# Patient Record
Sex: Male | Born: 1980 | Race: White | Hispanic: No | Marital: Single | Smoking: Former smoker
Health system: Southern US, Community
[De-identification: ages and names within clinical notes are randomized; demographics above are authoritative.]

## PROBLEM LIST (undated history)

## (undated) DIAGNOSIS — M7989 Other specified soft tissue disorders: Secondary | ICD-10-CM

## (undated) DIAGNOSIS — Z973 Presence of spectacles and contact lenses: Secondary | ICD-10-CM

## (undated) DIAGNOSIS — L568 Other specified acute skin changes due to ultraviolet radiation: Secondary | ICD-10-CM

## (undated) DIAGNOSIS — I456 Pre-excitation syndrome: Secondary | ICD-10-CM

## (undated) HISTORY — PX: NO PAST SURGERIES: SHX2092

---

## 2001-03-28 ENCOUNTER — Encounter: Payer: Self-pay | Admitting: Emergency Medicine

## 2001-03-28 ENCOUNTER — Emergency Department (HOSPITAL_COMMUNITY): Admission: EM | Admit: 2001-03-28 | Discharge: 2001-03-28 | Payer: Self-pay | Admitting: Emergency Medicine

## 2004-11-13 ENCOUNTER — Emergency Department (HOSPITAL_COMMUNITY): Admission: EM | Admit: 2004-11-13 | Discharge: 2004-11-13 | Payer: Self-pay | Admitting: Emergency Medicine

## 2006-10-21 ENCOUNTER — Emergency Department (HOSPITAL_COMMUNITY): Admission: EM | Admit: 2006-10-21 | Discharge: 2006-10-21 | Payer: Self-pay | Admitting: Emergency Medicine

## 2008-01-11 ENCOUNTER — Emergency Department (HOSPITAL_COMMUNITY): Admission: EM | Admit: 2008-01-11 | Discharge: 2008-01-11 | Payer: Self-pay | Admitting: Family Medicine

## 2010-01-16 ENCOUNTER — Emergency Department (HOSPITAL_COMMUNITY)
Admission: EM | Admit: 2010-01-16 | Discharge: 2010-01-16 | Payer: Self-pay | Source: Home / Self Care | Admitting: Emergency Medicine

## 2011-01-02 LAB — DIFFERENTIAL
Basophils Absolute: 0
Basophils Relative: 0
Eosinophils Absolute: 0.2
Eosinophils Relative: 1
Lymphocytes Relative: 17
Lymphs Abs: 3.1
Monocytes Absolute: 0.3
Monocytes Relative: 2 — ABNORMAL LOW
Neutro Abs: 14.9 — ABNORMAL HIGH
Neutrophils Relative %: 81 — ABNORMAL HIGH

## 2011-01-02 LAB — CBC
HCT: 47
Hemoglobin: 16.1
MCHC: 34.3
MCV: 86.1
Platelets: 319
RBC: 5.46
RDW: 13.2
WBC: 18.5 — ABNORMAL HIGH

## 2011-01-02 LAB — BASIC METABOLIC PANEL
BUN: 7
CO2: 24
Calcium: 10
Chloride: 106
Creatinine, Ser: 0.97
GFR calc Af Amer: >60
GFR calc non Af Amer: >60
Glucose, Bld: 105 — ABNORMAL HIGH
Potassium: 3.6
Sodium: 141

## 2011-01-02 LAB — RAPID STREP SCREEN (MED CTR MEBANE ONLY): Streptococcus, Group A Screen (Direct): NEGATIVE

## 2011-01-02 LAB — MONONUCLEOSIS SCREEN: Mono Screen: NEGATIVE

## 2011-10-31 ENCOUNTER — Encounter (HOSPITAL_COMMUNITY): Payer: Self-pay | Admitting: *Deleted

## 2011-10-31 ENCOUNTER — Emergency Department (HOSPITAL_COMMUNITY)
Admission: EM | Admit: 2011-10-31 | Discharge: 2011-10-31 | Disposition: A | Discharge status: Home / Self Care | Payer: Self-pay | Attending: Emergency Medicine | Admitting: Emergency Medicine

## 2011-10-31 DIAGNOSIS — H6090 Unspecified otitis externa, unspecified ear: Secondary | ICD-10-CM

## 2011-10-31 DIAGNOSIS — Z87891 Personal history of nicotine dependence: Secondary | ICD-10-CM | POA: Insufficient documentation

## 2011-10-31 DIAGNOSIS — H60399 Other infective otitis externa, unspecified ear: Secondary | ICD-10-CM | POA: Insufficient documentation

## 2011-10-31 MED ORDER — TRAMADOL HCL 50 MG PO TABS: 50.0000 mg | ORAL_TABLET | Freq: Four times a day (QID) | ORAL | Status: AC | PRN

## 2011-10-31 MED ORDER — IBUPROFEN 800 MG PO TABS
800.0000 mg | ORAL_TABLET | Freq: Once | ORAL | Status: AC
  Administered 2011-10-31: 800 mg via ORAL
  Filled 2011-10-31: qty 1

## 2011-10-31 MED ORDER — IBUPROFEN 800 MG PO TABS: 800.0000 mg | ORAL_TABLET | Freq: Three times a day (TID) | ORAL | Status: AC | PRN

## 2011-10-31 MED ORDER — CIPROFLOXACIN-DEXAMETHASONE 0.3-0.1 % OT SUSP
4.0000 [drp] | Freq: Once | OTIC | Status: AC
  Administered 2011-10-31: 4 [drp] via OTIC
  Filled 2011-10-31: qty 7.5

## 2011-10-31 NOTE — ED Notes (Signed)
Waiting for ear drops to come from pharmacy prior to discharge.

## 2011-10-31 NOTE — ED Provider Notes (Signed)
History     CSN: [id number]  Arrival date & time 10/31/11  8657   First MD Initiated Contact with Patient 10/31/11 [phone]      Chief Complaint  Patient presents with  . Otalgia    (Consider location/radiation/quality/duration/timing/severity/associated sxs/prior treatment) HPI Comments: Patient with ear pain for 2-3 days. Patient noticed worsening pain and crusting starting yesterday. No fever, N/V, redness of ear or around ear. Pain radiates behind ear. No history of DM. 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 No treatments prior to arrival. Subjective decrease in hearing. Onset gradual. Course is gradually worsening. Nothing makes symptoms better. Movement of the ear makes symptoms worse.   Patient is a 31 y.o. male presenting with ear pain. The history is provided by the patient.  Otalgia This is a new problem. The current episode started 2 days ago. There is pain in the right ear. The problem occurs constantly. The problem has been gradually worsening. There has been no fever. The pain is moderate. Associated symptoms include ear discharge (crusting). Pertinent negatives include no headaches, no hearing loss, no rhinorrhea, no sore throat, no abdominal pain, no diarrhea, no vomiting, no neck pain and no rash.    History reviewed. No pertinent past medical history.  History reviewed. No pertinent past surgical history.  No family history on file.  History  Substance Use Topics  . Smoking status: Former Smoker    Quit date: 09/18/2011  . Smokeless tobacco: Not on file  . Alcohol Use: No      Review of Systems  Constitutional: Negative for fever.  HENT: Positive for ear pain and ear discharge (crusting). Negative for hearing loss, sore throat, rhinorrhea and neck pain.   Eyes: Negative for redness.  Gastrointestinal: Negative for nausea, vomiting, abdominal pain and diarrhea.  Skin: Negative for rash.    Neurological: Negative for headaches.    Allergies  Penicillins  Home Medications   Current Outpatient Rx  Name Route Sig Dispense Refill  . IBUPROFEN 800 MG PO TABS Oral Take 1 tablet (800 mg total) by mouth every 8 (eight) hours as needed for pain. 15 tablet 0  . TRAMADOL HCL 50 MG PO TABS Oral Take 1 tablet (50 mg total) by mouth every 6 (six) hours as needed for pain. 15 tablet 0    BP 113/73  Pulse 54  Temp 97.3 F (36.3 C)  Resp 18  SpO2 100%  Physical Exam  Nursing note and vitals reviewed. Constitutional: He appears well-developed and well-nourished.  HENT:  Head: Normocephalic and atraumatic.  Right Ear: Tympanic membrane normal. No lacerations. There is drainage (crusting) and tenderness (with tugging on external ear). No swelling. No foreign bodies. There is mastoid tenderness (mild). Tympanic membrane is not injected, not perforated and not erythematous. No hemotympanum. No decreased hearing is noted.  Left Ear: Hearing and tympanic membrane normal. No drainage. Tympanic membrane is not injected. No decreased hearing is noted.  Nose: Nose normal.  Mouth/Throat: Uvula is midline, oropharynx is clear and moist and mucous membranes are normal.  Eyes: Conjunctivae are normal. Right eye exhibits no discharge. Left eye exhibits no discharge.  Neck: Normal range of motion. Neck supple.  Cardiovascular: Normal rate, regular rhythm and normal heart sounds.   Pulmonary/Chest: Effort normal and breath sounds normal.  Abdominal: Soft. There is no tenderness.  Neurological: He is alert.  Skin: Skin is warm and dry.  Psychiatric: He has a normal  mood and affect.    ED Course  Procedures (including critical care time)  Labs Reviewed - No data to display No results found.   1. Otitis externa    7:13 AM Patient seen and examined. Medications ordered.   Vital signs reviewed and are as follows: Filed Vitals:   10/31/11 0657  BP: 113/73  Pulse: 54  Temp: 97.3 F  (36.3 C)  Resp: 18   Patient counseled on signs and symptoms to return including worsening redness of his ear or around his ear, fever, pus draining from his ear. Patient given ENT followup and urged followup if no improvement in the next 2-3 days. Patient verbalizes understanding and agrees with the plan.  Patient counseled on use of narcotic pain medications. Counseled not to combine these medications with others containing tylenol. Urged not to drink alcohol, drive, or perform any other activities that requires focus while taking these medications. The patient verbalizes understanding and agrees with the plan.  MDM  Desquamation and erythema of ear canal -- consistent with otitis externa. Minimal mastoid tenderness. Pain with movement of pinna. No redness or erythema consistent with malignant otitis externa or mastoiditis. No fever or h/o DM. Patient appears well and non-toxic. Appropriate return instructions given.         [place], [place] 10/31/11 [phone]

## 2011-10-31 NOTE — ED Provider Notes (Signed)
Medical screening examination/treatment/procedure(s) were performed by non-physician practitioner and as supervising physician I was immediately available for consultation/collaboration.  Christionna Poland, MD 10/31/11 0730 

## 2011-10-31 NOTE — ED Notes (Signed)
C/o R ear & R mastoid area pain, possibly some drainage (caked up in ear, clear-ish), wears ear plugs at work, has not inserted drops, mentions using Q tip on Sunday.

## 2011-11-20 ENCOUNTER — Encounter (HOSPITAL_COMMUNITY): Payer: Self-pay | Admitting: *Deleted

## 2011-11-20 ENCOUNTER — Emergency Department (HOSPITAL_COMMUNITY)
Admission: EM | Admit: 2011-11-20 | Discharge: 2011-11-20 | Disposition: A | Discharge status: Home / Self Care | Payer: Self-pay | Attending: Emergency Medicine | Admitting: Emergency Medicine

## 2011-11-20 DIAGNOSIS — L02519 Cutaneous abscess of unspecified hand: Secondary | ICD-10-CM | POA: Insufficient documentation

## 2011-11-20 DIAGNOSIS — Z87891 Personal history of nicotine dependence: Secondary | ICD-10-CM | POA: Insufficient documentation

## 2011-11-20 DIAGNOSIS — L03019 Cellulitis of unspecified finger: Secondary | ICD-10-CM | POA: Insufficient documentation

## 2011-11-20 DIAGNOSIS — L0291 Cutaneous abscess, unspecified: Secondary | ICD-10-CM

## 2011-11-20 DIAGNOSIS — Z880 Allergy status to penicillin: Secondary | ICD-10-CM | POA: Insufficient documentation

## 2011-11-20 DIAGNOSIS — Z23 Encounter for immunization: Secondary | ICD-10-CM | POA: Insufficient documentation

## 2011-11-20 MED ORDER — HYDROCODONE-ACETAMINOPHEN 7.5-500 MG PO TABS: 1.0000 | ORAL_TABLET | Freq: Four times a day (QID) | ORAL | Status: AC | PRN

## 2011-11-20 MED ORDER — ACETAMINOPHEN 325 MG PO TABS
975.0000 mg | ORAL_TABLET | Freq: Once | ORAL | Status: AC
  Administered 2011-11-20: 975 mg via ORAL
  Filled 2011-11-20: qty 3

## 2011-11-20 MED ORDER — TETANUS-DIPHTH-ACELL PERTUSSIS 5-2.5-18.5 LF-MCG/0.5 IM SUSP
0.5000 mL | Freq: Once | INTRAMUSCULAR | Status: AC
  Administered 2011-11-20: 0.5 mL via INTRAMUSCULAR
  Filled 2011-11-20: qty 0.5

## 2011-11-20 MED ORDER — SULFAMETHOXAZOLE-TRIMETHOPRIM 800-160 MG PO TABS: 1.0000 | ORAL_TABLET | Freq: Two times a day (BID) | ORAL | Status: AC

## 2011-11-20 NOTE — Consult Note (Signed)
Reason for Consult: left small finger infection   Referring Physician: [name] is an 31 y.o. male.  HPI: See ED note from Dr. Ethelda Clark  History reviewed. No pertinent past medical history.  History reviewed. No pertinent past surgical history.  History reviewed. No pertinent family history.  Social History:  reports that he quit smoking about 2 months ago. He does not have any smokeless tobacco history on file. He reports that he uses illicit drugs (Marijuana). He reports that he does not drink alcohol.  Allergies:  Allergies  Allergen Reactions  . Penicillins Hives and Rash    Medications: I have reviewed the patient's current medications.  No results found for this or any previous visit (from the past 48 hour(s)).  No results found.  No RECENT ILLNESSES OR HOSPITALIZATIONS  Blood pressure 111/68, pulse 53, temperature 97.5 F (36.4 C), temperature source Oral, resp. rate 15, SpO2 100.00%. General Appearance:  Alert, cooperative, no distress, appears stated age  Head:  Normocephalic, without obvious abnormality, atraumatic  Eyes:  Pupils equal, conjunctiva/corneas clear,         Throat: Lips, mucosa, and tongue normal; teeth and gums normal  Neck: No visible masses     Lungs:   respirations unlabored  Chest Wall:  No tenderness or deformity  Heart:  Regular rate and rhythm,  Abdomen:   Soft, non-tender,         Extremities: LEFT HAND: MILD SWELLING AND FLUCTUANCE OVER DORSUM OF SMALL FINGER P1 REGION. FINGERS WARM WELL PERFUSED MILD REDNESS DORSUM OF HAND. NO EVIDENCE OF DEEP SPACE INFECTION NO INVOLVEMENT OF LONG/RING/INDEX/THUMB   Pulses: 2+ and symmetric  Skin: Skin color, texture, turgor normal, no rashes or lesions     Neurologic: Normal   Assessment/Plan: LEFT SMALL FINGER ABSCESS  PROCEDURE: AFTER VERBAL CONSENT OBTAINED WE PERFORMED BEDSIDE I/D. FINGER WAS ANESTHETIZED WITH 1% LIDOCAINE, FINGER PREPPED WITH BETADINE AND  THEN LOCAL INCISION AND DRAINAGE PERFORMED. WOUND CULTURES TAKEN SMALL AMOUNT OF PURULENCE EXPRESSED FROM AREA WOUND THEN PACKED WITH IODOFORM GAUZE PATIENT TOLERATED STERILE DRESSING APPLIED AND SMALL FINGER SPLINT PT TOLERATED PROCEDURE.  PLAN: ORAL ABX, PCN ALLERGY WILL DO BACTRIM F/U IN OFFICE ON Thursday FOR PACKING CHANGE KEEP DRESSING ON AT ALL TIMES ORAL PAIN MEDS PT VOICED UNDERSTANDING OF PLAN  Julian Clark 11/20/2011, 5:18 PM

## 2011-11-20 NOTE — ED Provider Notes (Signed)
History   This chart was scribed for Doug Sou, MD by Melba Coon. The patient was seen in room TR05C/TR05C and the patient's care was started at 3:30PM.    CSN: [id number]  Arrival date & time 11/20/11  1305   None     Chief Complaint  Patient presents with  . Hand Injury    (Consider location/radiation/quality/duration/timing/severity/associated sxs/prior treatment) The history is provided by the patient. No language interpreter was used.   ENOC GETTER is a 31 y.o. male who presents to the Emergency Department complaining of constant, moderate to severe left hand pain, redness and swelling with an onset 3 days ago. Pt has a small wound at the site of infection but cause of the wound is unknown. Pt states that the pain radiates to his left elbow. Tetanus shot is not up to date. No other pertinent medical symptoms.   History reviewed. No pertinent past medical history.  History reviewed. No pertinent past surgical history.  History reviewed. No pertinent family history.  History  Substance Use Topics  . Smoking status: Former Smoker    Quit date: 09/18/2011  . Smokeless tobacco: Not on file  . Alcohol Use: No      Review of Systems  Constitutional: Negative.   HENT: Negative.   Respiratory: Negative.   Cardiovascular: Negative.   Gastrointestinal: Negative.   Musculoskeletal: Positive for arthralgias (left hand).  Skin: Negative.   Neurological: Negative.   Hematological: Negative.   Psychiatric/Behavioral: Negative.      Allergies  Penicillins  Home Medications  No current outpatient prescriptions on file.  BP 111/68  Pulse 53  Temp 97.5 F (36.4 C) (Oral)  Resp 15  SpO2 100%  Physical Exam  Nursing note and vitals reviewed. Constitutional: He is oriented to person, place, and time. He appears well-developed and well-nourished. No distress.  HENT:  Head: Normocephalic and atraumatic.  Eyes: EOM are normal.  Neck: Neck supple. No  tracheal deviation present.  Cardiovascular: Normal rate.   Pulmonary/Chest: Effort normal. No respiratory distress.  Musculoskeletal: Normal range of motion. He exhibits tenderness.       LUE: Dime sized lesion on left hand on proximal phalanx, dorsal aspect; entirety of dorsum of left hand is swollen and mildly tender; volar mild tender; no red streaks up arm or axial arm; radial pulse 2+  Neurological: He is alert and oriented to person, place, and time.  Skin: Skin is warm and dry.  Psychiatric: He has a normal mood and affect. His behavior is normal.    ED Course  Procedures (including critical care time)  DIAGNOSTIC STUDIES: Oxygen Saturation is 99% on room air, normal by my interpretation.    COORDINATION OF CARE:  3:33PM - hand specialist will be consulted. Tetanus shot and tylenol will be ordered for the pt.   Labs Reviewed - No data to display No results found.   No diagnosis found.  4:25 PM spoke with Dr.Ortmann who will come to evaluate patient  MDM  Suspect early extensor tenosynovitis  Diagnosis abscess left fifth finger  I personally performed the services described in this documentation, which was scribed in my presence. The recorded information has been reviewed and considered.        Doug Sou, MD 11/20/11 [phone]

## 2011-11-20 NOTE — ED Notes (Signed)
To ED for eval of left hand redness and swelling since Friday. Pt has small wound to left small finger. Unknown injury or bite.

## 2011-11-23 LAB — CULTURE, ROUTINE-ABSCESS

## 2011-11-24 NOTE — ED Notes (Signed)
+  Abscess. +MRSA. Chart sent to EDP office for review. Will call and inform patient of +MRSA.

## 2011-11-26 NOTE — ED Notes (Signed)
Left message for patient to call back  

## 2011-11-27 NOTE — ED Notes (Signed)
I have attempted to contact this patient by phone with the following results: massage left with mother for patient to return call.

## 2011-11-28 NOTE — ED Notes (Signed)
Patient followed up with Dr Orlan Leavens today per mother.

## 2013-06-14 ENCOUNTER — Emergency Department (HOSPITAL_COMMUNITY)
Admission: EM | Admit: 2013-06-14 | Discharge: 2013-06-14 | Disposition: A | Discharge status: Home / Self Care | Payer: Medicaid Other | Attending: Emergency Medicine | Admitting: Emergency Medicine

## 2013-06-14 DIAGNOSIS — Y929 Unspecified place or not applicable: Secondary | ICD-10-CM | POA: Insufficient documentation

## 2013-06-14 DIAGNOSIS — S61411A Laceration without foreign body of right hand, initial encounter: Secondary | ICD-10-CM

## 2013-06-14 DIAGNOSIS — Y9389 Activity, other specified: Secondary | ICD-10-CM | POA: Insufficient documentation

## 2013-06-14 DIAGNOSIS — S61409A Unspecified open wound of unspecified hand, initial encounter: Secondary | ICD-10-CM | POA: Insufficient documentation

## 2013-06-14 DIAGNOSIS — Z87891 Personal history of nicotine dependence: Secondary | ICD-10-CM | POA: Insufficient documentation

## 2013-06-14 DIAGNOSIS — Z880 Allergy status to penicillin: Secondary | ICD-10-CM | POA: Insufficient documentation

## 2013-06-14 DIAGNOSIS — W298XXA Contact with other powered powered hand tools and household machinery, initial encounter: Secondary | ICD-10-CM | POA: Insufficient documentation

## 2013-06-14 NOTE — Discharge Instructions (Signed)
Keep wound clean and dry  Wash daily with soap and water  Return to the emergency department if you develop any changing/worsening condition, pus, spreading redness/swelling, fever, severe pain, or any other concerns (please read additional information regarding your condition below)     Laceration Care, Adult A laceration is a cut or lesion that goes through all layers of the skin and into the tissue just beneath the skin. TREATMENT  Some lacerations may not require closure. Some lacerations may not be able to be closed due to an increased risk of infection. It is important to see your caregiver as soon as possible after an injury to minimize the risk of infection and maximize the opportunity for successful closure. If closure is appropriate, pain medicines may be given, if needed. The wound will be cleaned to help prevent infection. Your caregiver will use stitches (sutures), staples, wound glue (adhesive), or skin adhesive strips to repair the laceration. These tools bring the skin edges together to allow for faster healing and a better cosmetic outcome. However, all wounds will heal with a scar. Once the wound has healed, scarring can be minimized by covering the wound with sunscreen during the day for 1 full year. HOME CARE INSTRUCTIONS  For sutures or staples:  Keep the wound clean and dry.  If you were given a bandage (dressing), you should change it at least once a day. Also, change the dressing if it becomes wet or dirty, or as directed by your caregiver.  Wash the wound with soap and water 2 times a day. Rinse the wound off with water to remove all soap. Pat the wound dry with a clean towel.  After cleaning, apply a thin layer of the antibiotic ointment as recommended by your caregiver. This will help prevent infection and keep the dressing from sticking.  You may shower as usual after the first 24 hours. Do not soak the wound in water until the sutures are removed.  Only take  over-the-counter or prescription medicines for pain, discomfort, or fever as directed by your caregiver.  Get your sutures or staples removed as directed by your caregiver. For skin adhesive strips:  Keep the wound clean and dry.  Do not get the skin adhesive strips wet. You may bathe carefully, using caution to keep the wound dry.  If the wound gets wet, pat it dry with a clean towel.  Skin adhesive strips will fall off on their own. You may trim the strips as the wound heals. Do not remove skin adhesive strips that are still stuck to the wound. They will fall off in time. For wound adhesive:  You may briefly wet your wound in the shower or bath. Do not soak or scrub the wound. Do not swim. Avoid periods of heavy perspiration until the skin adhesive has fallen off on its own. After showering or bathing, gently pat the wound dry with a clean towel.  Do not apply liquid medicine, cream medicine, or ointment medicine to your wound while the skin adhesive is in place. This may loosen the film before your wound is healed.  If a dressing is placed over the wound, be careful not to apply tape directly over the skin adhesive. This may cause the adhesive to be pulled off before the wound is healed.  Avoid prolonged exposure to sunlight or tanning lamps while the skin adhesive is in place. Exposure to ultraviolet light in the first year will darken the scar.  The skin adhesive will usually  remain in place for 5 to 10 days, then naturally fall off the skin. Do not pick at the adhesive film. You may need a tetanus shot if:  You cannot remember when you had your last tetanus shot.  You have never had a tetanus shot. If you get a tetanus shot, your arm may swell, get red, and feel warm to the touch. This is common and not a problem. If you need a tetanus shot and you choose not to have one, there is a rare chance of getting tetanus. Sickness from tetanus can be serious. SEEK MEDICAL CARE IF:   You  have redness, swelling, or increasing pain in the wound.  You see a red line that goes away from the wound.  You have yellowish-white fluid (pus) coming from the wound.  You have a fever.  You notice a bad smell coming from the wound or dressing.  Your wound breaks open before or after sutures have been removed.  You notice something coming out of the wound such as wood or glass.  Your wound is on your hand or foot and you cannot move a finger or toe. SEEK IMMEDIATE MEDICAL CARE IF:   Your pain is not controlled with prescribed medicine.  You have severe swelling around the wound causing pain and numbness or a change in color in your arm, hand, leg, or foot.  Your wound splits open and starts bleeding.  You have worsening numbness, weakness, or loss of function of any joint around or beyond the wound.  You develop painful lumps near the wound or on the skin anywhere on your body. MAKE SURE YOU:   Understand these instructions.  Will watch your condition.  Will get help right away if you are not doing well or get worse. Document Released: 03/06/2005 Document Revised: 05/29/2011 Document Reviewed: 08/30/2010 [facility] Patient Information 2014 [place], [place].   Emergency Department Resource Guide 1) Find a Doctor and Pay Out of Pocket Although you won't have to find out who is covered by your insurance plan, it is a good idea to ask around and get recommendations. You will then need to call the office and see if the doctor you have chosen will accept you as a new patient and what types of options they offer for patients who are self-pay. Some doctors offer discounts or will set up payment plans for their patients who do not have insurance, but you will need to ask so you aren't surprised when you get to your appointment.  2) Contact Your Local Health Department Not all health departments have doctors that can see patients for sick visits, but many do, so it is worth a call to  see if yours does. If you don't know where your local health department is, you can check in your phone book. The CDC also has a tool to help you locate your state's health department, and many state websites also have listings of all of their local health departments.  3) Find a [facility] If your illness is not likely to be very severe or complicated, you may want to try a walk in clinic. These are popping up all over the country in pharmacies, drugstores, and shopping centers. They're usually staffed by nurse practitioners or physician assistants that have been trained to treat common illnesses and complaints. They're usually fairly quick and inexpensive. However, if you have serious medical issues or chronic medical problems, these are probably not your best option.  No Primary Care Doctor: -  Call Health Connect at  [phone] - they can help you locate a primary care doctor that  accepts your insurance, provides certain services, etc. - Physician Referral Service- [phone]  Chronic Pain Problems: Organization         Address  Phone   Notes  [facility]  [phone] Patients need to be referred by their primary care doctor.   Medication Assistance: Organization         Address  Phone   Notes  [facility] Medication [facility] [address] [place] [place], [place] [address] [phone] --Must be a resident of [facility] -- Must have NO insurance coverage whatsoever (no Medicaid/ Medicare, etc.) -- The pt. MUST have a primary care doctor that directs their care regularly and follows them in the community   Med[facility]ist  [phone]   [employer]  [phone]    Agencies that provide inexpensive medical care: Organization         Address  Phone   Notes  [facility]  [phone]   [facility]    [phone]   [facility] [address] [place], [place] [address] [phone]   [facility] [address] [place], [place] [phone]   Planned Parenthood    [phone]   [facility]    [phone]   [facility]  [address], [place] Phone:  [phone], Fax:  [phone] Hours of Operation:  9 am - 6 pm, M-F.  Also accepts Medicaid/Medicare and self-pay.  [facility]  [address], [place] Phone: [phone], Fax: [phone]. Hours of Operation:  8:30 am - 5:30 pm, M-F.  Also accepts Medicaid and self-pay.  [facility] [place] [address] [place] Phone: [phone]   [facility] [address] Natasha Bence [place], [place] [phone], Ext. [phone] Mondays & Thursdays: 7-9 AM.  First 15 patients are seen on a first come, first serve basis.    Medicaid-accepting [facility] Providers:  Organization         Address  Phone   Notes  [facility] [address], [place] [phone] Also accepts self-pay patients.  [facility] [address] Laurell Josephs [place], [place]  [phone]   [facility] [address], [place] [phone]   [facility] [address], [place] [phone]   Renaye Rakers [address], [place]   [phone] Only accepts [place] Access [place] patients after they have their name applied to their card.   Self-Pay (no insurance) in [facility]:  Organization         Address  Phone   Notes  Sickle Cell Patients, [facility] [address] [place], [place] [phone]   [facility] Urgent Care [address] [place], [place] [phone]   [facility] [place]  [address], [place] [phone]   [facility]/Dr. Osei-Bonsu  [address] [place] or [address], [place] [phone] Phone number for both [place] and [place] locations is the same.  [facility]gent Medical and [facility] [address] [place]in[place] Otto [phone]   [facility] [place]  [address], [place] or [address] [phone]   [facility] [place] [phone], phone; [phone], fax Sees patients 1st and 3rd Saturday of every month.  Must not qualify for public or private insurance (i.e. Medicaid, Medicare, [place] Health Choice, Veterans' Benefits)  Household income should be no more than 200% of the poverty level The clinic cannot treat you if you are pregnant or think you are pregnant  Sexually transmitted diseases are not treated at the clinic.    Dental Care: Organization         Address  Phone  Notes  [facility] Department of [facility] [phone] Accepts children up to age 76 who are enrolled in [place] or [place]; pregnant women with a Medicaid card; and children who have applied for Medicaid or [place] Health Choice, but were declined, whose parents can pay a reduced fee at time of service.  [facility] Department of [facility]  [address], [place] [phone] Accepts children up to age 60 who are enrolled in [place] or [place]; pregnant women with a Medicaid card; and children who have applied for Medicaid or [place] Health Choice, but were declined, whose parents can pay a reduced fee at time of service.  [place] Adult Dental Access PROGRAM  [place] [phone] Patients are seen by appointment only. Walk-ins are not accepted. [place] will see patients 18 years of age and older. Monday - Tuesday (8am-5pm) Most Wednesdays (8:30-5pm) $30 per visit, cash only  [facility] Adult Dental Access PROGRAM  [address], [facility] [phone] Patients are  seen by appointment only. Walk-ins are not accepted. Scott will see patients 62 years of age and older. One Wednesday Evening (Monthly: Volunteer Based).  $30 per visit, cash only  [place]  [phone] for adults; Children under age 37, call Graduate Pediatric Dentistry at [phone]. Children aged 63-14, please call [phone] to request a pediatric application.  Dental services are provided in all areas of dental care including fillings, crowns and bridges, complete and partial dentures, implants, gum treatment, root canals, and extractions. Preventive care is also provided. Treatment is provided to both adults and children. Patients are selected via a lottery and there is often a waiting list.   [facility] [address] [place]  [phone] [email or web address]   [facility] [address] [place], [place] [phone], Ext. [phone] Second and Fourth Thursday of each month, opens at 6:30 AM; Clinic ends at 9 AM.  Patients are seen on a first-come first-served basis, and a limited number are seen during each clinic.   [facility]  [address] Hillard Danker [place], [place] [phone]   Eligibility Requirements You must have lived in [place], [place], or [place] counties for at least the last three months.   You cannot be eligible for state or federal sponsored [employer], including [employer], [place], or [employer].   You generally cannot be eligible for healthcare insurance through your employer.    How to apply: Eligibility screenings are held every Tuesday and Wednesday afternoon from 1:00 pm until 4:00 pm. You do not need an appointment for the interview!  [facility] [address], [place], [place]   [place]  [place] Department  [place]  [phone]     Behavioral Health Resources in the Community: Intensive Outpatient Programs Organization         Address  Phone  Notes  [place]. [address] [place], [place] [phone]   [facility] Outpatient [address] [place], [place]   ADS: [facility] [address] [place], [place]   [place] [address],  [place], [place] or [phone]   Substance Abuse Resources Organization         Address  Phone  Notes  Alcohol and Drug Services  [phone]   [place]  [phone]   [place]   Chinita Pester  [phone]   Residential & Outpatient Substance Abuse Program  [phone]   Psychological Services Organization         Address  Phone  Notes  [facility] [place]  [place]  [phone]   [place] [address] [place] or [phone]    Mobile Crisis Teams Organization         Address  Phone  Notes  [facility], Mobile Crisis Care Unit  [phone]   [facility]  [address] [place], [place]   [place] [address], [place] [phone]    Self-Help/Support Groups Organization         Address  Phone             Notes  [place]. of [place] - variety of support groups  [place] Call for more information  Narcotics Anonymous (NA), [facility] [address], [employer]   2 meetings at this location   Special educational needs teacher         Address  Phone  Notes  [facility] [place],    [place]  [phone]   [facility]  [address] [place] [phone], [place], [place]   [place], [place] [phone] Admissions: 8am-3pm M-F  Incentives  Substance [place] [address],    [place], [place] [phone]   [facility] [address] [place], [place], [place]   [facility] [address],  [place], [place]   [facility] - Intensive Outpatient [address] Kristeen Mans [address], [place], [place]   [facility] ([place].,  [place], [place] [phone] or [phone]   [facility] (RTS) [address], [place], [place] Accepts Medicaid  [facility] [place] [address],  [place] [phone] Substance Abuse/Addiction Treatment   [facility] Organization         Address  Phone  Notes  [facility]  [phone]   Domenic Schwab, PhD [address] Arlis Porta [place], [place]   [phone] or [phone]   [facility] [place], [place] [phone]   [place] [address] [place], [place] [phone] Insurance/Medicaid/sponsorship through [employer] and Families [address]  [place], [place] [phone] [place], [place] [phone]    Dr. Adele Schilder  [phone]   [facility] Dept. 1) [address], [place] 2) [place] 3)  [place] [address], [place] [phone]  [phone]   [place] [phone] or [phone] (After Hours)

## 2013-06-14 NOTE — ED Notes (Signed)
Pt reports he was cleaning his gun when he cut hand. Pain 4/10 ,Pt moves all his fingers without difficulty.

## 2013-06-14 NOTE — ED Provider Notes (Signed)
CSN: [id number]     Arrival date & time 06/14/13  1714 History  This chart was scribed for non-physician practitioner, Julian Murders, PA-C working with Julian Lanes, MD by Julian Clark, ED scribe. This patient was seen in room TR08C/TR08C and the patient's care was started at 6:10 PM.   Chief Complaint  Patient presents with  . Laceration    RT Hand   The history is provided by the patient. No language interpreter was used.   HPI Comments: Julian Clark is a 33 y.o. male who presents to the Emergency Department complaining of a laceration to his right hand that occurred prior to arrival. He states he was cleaning his gun and accidentally cut himself. No foreign bodies. Pt has mild pain around the area with movement but denies any other complaints. He has cleaned the wound with water. Pt's last tetanus was one year ago. No numbness, tingling, loss of sensation, and weakness. Bleeding controlled.    No past medical history on file. No past surgical history on file. No family history on file. History  Substance Use Topics  . Smoking status: Former Smoker    Quit date: 09/18/2011  . Smokeless tobacco: Not on file  . Alcohol Use: No    Review of Systems  Skin: Positive for wound. Negative for color change.  Neurological: Negative for weakness and numbness.  All other systems reviewed and are negative.   Allergies  Penicillins  Home Medications  No current outpatient prescriptions on file.  BP 105/57  Pulse 60  Temp(Src) 98.6 F (37 C)  Resp 15  Ht 6' (1.829 m)  Wt 163 lb (73.936 kg)  BMI 22.10 kg/m2  SpO2 100%  Filed Vitals:   06/14/13 1738  BP: 105/57  Pulse: 60  Temp: 98.6 F (37 C)  Resp: 15  Height: 6' (1.829 m)  Weight: 163 lb (73.936 kg)  SpO2: 100%    Physical Exam  Nursing note and vitals reviewed. Constitutional: He is oriented to person, place, and time. He appears well-developed and well-nourished. No distress.  HENT:  Head: Normocephalic  and atraumatic.  Eyes: EOM are normal.  Neck: Neck supple. No tracheal deviation present.  Cardiovascular: Normal rate.   Radial pulses present and equal bilaterally  Pulmonary/Chest: Effort normal. No respiratory distress.  Musculoskeletal: Normal range of motion.       Hands: Patient able to flex and extend digits of right hand without difficulty or limitations.   Neurological: He is alert and oriented to person, place, and time.  Sensation intact in the UE   Skin: Skin is warm and dry.  1.5 cm superficial linear laceration between the snuff box and MCP joint of the right 1st dorsal phalanx. Bleeding controlled. No surrounding edema, erythema or ecchymosis. Area tender to palpation. No foreign bodies.   Psychiatric: He has a normal mood and affect. His behavior is normal.    ED Course  Procedures (including critical care time)  DIAGNOSTIC STUDIES: Oxygen Saturation is 100% on RA, normal by my interpretation.    COORDINATION OF CARE: 6:11 PM-Discussed treatment plan which includes laceration repair with pt at bedside and pt agreed to plan.   LACERATION REPAIR PROCEDURE NOTE The patient's identification was confirmed and consent was obtained. This procedure was performed by Julian Murders, PA-C at 6:16 PM. Site: inferior to MP joint on dorsal aspect of right hand between webspace of first and second phalanx Sterile procedures observed Anesthetic used (type and amt): 2 mL 2% lidocaine  without epi Suture type/size:4-0 ethilon Length: 1.5 cm # of Sutures: 2 Technique: simple interrupted Complexity: simple Antibiotic ointment applied Tetanus UTD Site anesthetized, irrigated with NS, explored without evidence of foreign body, wound well approximated, site covered with dry, sterile dressing.  Patient tolerated procedure well without complications. Instructions for care discussed verbally and patient provided with additional written instructions for homecare and f/u.  Labs  Review Labs Reviewed - No data to display Imaging Review No results found.   EKG Interpretation None      MDM   Julian Clark is a 33 y.o. male who presents to the Emergency Department complaining of a laceration to his right hand that occurred prior to arrival. Laceration repaired in the ED. Patient neurovascularly intact. Tetanus up to date. Wound care and follow-up for suture removal discussed with patient. Return precautions, discharge instructions, and follow-up was discussed with the patient before discharge.     There are no discharge medications for this patient.  Final impressions: 1. Laceration of right hand      Julian Moore PA-C   I personally performed the services described in this documentation, which was scribed in my presence. The recorded information has been reviewed and is accurate.        Julian [place], PA-C 06/15/13 1708

## 2013-06-21 NOTE — ED Provider Notes (Signed)
Medical screening examination/treatment/procedure(s) were performed by non-physician practitioner and as supervising physician I was immediately available for consultation/collaboration.   Dot Lanes, MD 06/21/13 [phone]

## 2013-12-20 ENCOUNTER — Encounter (HOSPITAL_COMMUNITY): Payer: Self-pay | Admitting: Emergency Medicine

## 2013-12-20 ENCOUNTER — Emergency Department (HOSPITAL_COMMUNITY)
Admission: EM | Admit: 2013-12-20 | Discharge: 2013-12-20 | Disposition: A | Discharge status: Home / Self Care | Payer: Medicaid Other | Attending: Emergency Medicine | Admitting: Emergency Medicine

## 2013-12-20 DIAGNOSIS — L0291 Cutaneous abscess, unspecified: Secondary | ICD-10-CM

## 2013-12-20 DIAGNOSIS — Z8679 Personal history of other diseases of the circulatory system: Secondary | ICD-10-CM | POA: Insufficient documentation

## 2013-12-20 DIAGNOSIS — L02413 Cutaneous abscess of right upper limb: Secondary | ICD-10-CM | POA: Insufficient documentation

## 2013-12-20 DIAGNOSIS — L03113 Cellulitis of right upper limb: Secondary | ICD-10-CM | POA: Diagnosis not present

## 2013-12-20 DIAGNOSIS — Z880 Allergy status to penicillin: Secondary | ICD-10-CM | POA: Diagnosis not present

## 2013-12-20 DIAGNOSIS — L0390 Cellulitis, unspecified: Secondary | ICD-10-CM

## 2013-12-20 DIAGNOSIS — Z720 Tobacco use: Secondary | ICD-10-CM | POA: Diagnosis not present

## 2013-12-20 HISTORY — DX: Pre-excitation syndrome: I45.6

## 2013-12-20 MED ORDER — SULFAMETHOXAZOLE-TRIMETHOPRIM 800-160 MG PO TABS: 2.0000 | ORAL_TABLET | Freq: Two times a day (BID) | ORAL | Status: AC

## 2013-12-20 NOTE — ED Notes (Signed)
Patient arrives with complaint of right arm abscess. States that it showed up late last week and has no responded to home treatment including warm compresses, I/D, and soaking. Currently appears to have abscess distal to right elbow on forearm. Right forearm mildly swollen compared to left. Denies fever.

## 2013-12-20 NOTE — ED Provider Notes (Signed)
CSN: [id number]     Arrival date & time 12/20/13  1924 History  This chart was scribed for non-physician practitioner working with Fredia Sorrow, MD by Mercy Moore, ED Scribe. This patient was seen in room TR09C/TR09C and the patient's care was started at 8:08 PM.   Chief Complaint  Patient presents with  . Abscess    The history is provided by the patient. No language interpreter was used.   HPI Comments: Julian Clark is a 33 y.o. male who presents to the Emergency Department complaining of abscess to posterior right arm below his elbow, onset last week with worsening over the last few days. Patient reports drainage, described a yellow pus, from the site. Patient describes aching, burning pain currently rated at 8/10. Patient reports exacerbated pain with tactile and applied pressure to the site.  Patient reports treatment with warm compresses, without relief.   Past Medical History  Diagnosis Date  . WPW (Wolff-Parkinson-White syndrome)    History reviewed. No pertinent past surgical history. No family history on file. History  Substance Use Topics  . Smoking status: Current Some Day Smoker    Last Attempt to Quit: 09/18/2011  . Smokeless tobacco: Not on file  . Alcohol Use: No    Review of Systems  Constitutional: Negative for fever and chills.  Skin: Positive for color change.       Abscess   All other systems reviewed and are negative.  Allergies  Penicillins  Home Medications   Prior to Admission medications   Not on File   Triage Vitals: BP 126/69  Pulse 69  Temp(Src) 99.3 F (37.4 C) (Oral)  Resp 18  SpO2 100%  Physical Exam  Nursing note and vitals reviewed. Constitutional: He is oriented to person, place, and time. He appears well-developed and well-nourished. No distress.  HENT:  Head: Normocephalic and atraumatic.  Eyes: EOM are normal.  Neck: Neck supple. No tracheal deviation present.  Cardiovascular: Normal rate.   Pulmonary/Chest:  Effort normal. No respiratory distress.  Musculoskeletal: Normal range of motion.  Neurological: He is alert and oriented to person, place, and time.  Skin: Skin is warm and dry. There is erythema.  Right forearm: 2.5 cm in diameter area of redness. Indurated along external border and central area of fluctuance.   Psychiatric: He has a normal mood and affect. His behavior is normal.    ED Course  Procedures (including critical care time) INCISION AND DRAINAGE Performed by: Britt Bottom Consent: Verbal consent obtained. Risks and benefits: risks, benefits and alternatives were discussed Type: abscess  Body area: Rt lateral forearm  Anesthesia: local infiltration  Incision was made with a scalpel.  Local anesthetic: lidocaine 1% without epinephrine  Anesthetic total: 3 ml  Complexity: complex Blunt dissection to break up loculations  Drainage: purulent  Drainage amount: moderate  Packing material: none  Patient tolerance: Patient tolerated the procedure well with no immediate complications.   COORDINATION OF CARE: 8:14 PM- Plans to incise and drain. Patient advised to treat with frequent warm compresses to allow draining. Discussed treatment plan with patient at bedside and patient agreed to plan.   Labs Review Labs Reviewed - No data to display  Imaging Review No results found.   EKG Interpretation None      MDM   Final diagnoses:  Cellulitis and abscess   33 yo male presenting with skin abscess.  It appears to be amenable to incision and drainage. He tolerated the procedure well. There are signs of  cellulitis in the surrounding skin after I&D. Discharge instructions include warm soaks and dressing changes as needed, prescription for bactrim and wound recheck in 2 days.  Pt verbalizes understanding of plan and in agreement.  Return precautions provided.     Filed Vitals:   12/20/13 1958 12/20/13 2133  BP: 126/69 104/60  Pulse: 69 60  Temp: 99.3 F  (37.4 C) 97.7 F (36.5 C)  TempSrc: Oral Oral  Resp: 18 16  SpO2: 100% 100%   Meds given in ED:  Medications - No data to display  Discharge Medication List as of 12/20/2013  9:35 PM    START taking these medications   Details  sulfamethoxazole-trimethoprim (BACTRIM DS,SEPTRA DS) 800-160 MG per tablet Take 2 tablets by mouth 2 (two) times daily., Starting 12/20/2013, Last dose on Sat 12/27/13, Print        I personally performed the services described in this documentation, which was scribed in my presence. The recorded information has been reviewed and is accurate.    Britt Bottom, NP 12/25/13 [phone]

## 2013-12-20 NOTE — Discharge Instructions (Signed)
Please follow the directions provided.  Be sure to return for a wound check in 2 days. He may return here go to the urgent care or see her primary care provider. Be sure to take your antibiotic until it is all gone. You may take ibuprofen 400 mg by mouth every 6 hours as needed for pain. Don't hesitate to return for any new concerning for worsening symptoms.  SEEK IMMEDIATE MEDICAL CARE IF:  You develop increased pain, swelling, redness, drainage, or bleeding in the wound site.  You develop signs of generalized infection including muscle aches, chills, fever, or a general ill feeling.  An oral temperature above 102 F (38.9 C) develops, not controlled by medication. See your caregiver for a recheck if you develop any of the symptoms described above. If medications (antibiotics) were prescribed, take them as directed.

## 2013-12-25 NOTE — ED Provider Notes (Signed)
Medical screening examination/treatment/procedure(s) were performed by non-physician practitioner and as supervising physician I was immediately available for consultation/collaboration.   EKG Interpretation None        Fredia Sorrow, MD 12/25/13 2103

## 2015-01-11 ENCOUNTER — Encounter (HOSPITAL_COMMUNITY): Payer: Self-pay | Admitting: *Deleted

## 2015-01-11 ENCOUNTER — Emergency Department (HOSPITAL_COMMUNITY)
Admission: EM | Admit: 2015-01-11 | Discharge: 2015-01-11 | Disposition: A | Discharge status: Home / Self Care | Payer: Managed Care, Other (non HMO) | Attending: Emergency Medicine | Admitting: Emergency Medicine

## 2015-01-11 DIAGNOSIS — F17210 Nicotine dependence, cigarettes, uncomplicated: Secondary | ICD-10-CM | POA: Insufficient documentation

## 2015-01-11 DIAGNOSIS — D179 Benign lipomatous neoplasm, unspecified: Secondary | ICD-10-CM | POA: Insufficient documentation

## 2015-01-11 DIAGNOSIS — Z880 Allergy status to penicillin: Secondary | ICD-10-CM | POA: Insufficient documentation

## 2015-01-11 DIAGNOSIS — D171 Benign lipomatous neoplasm of skin and subcutaneous tissue of trunk: Secondary | ICD-10-CM

## 2015-01-11 MED ORDER — IBUPROFEN 800 MG PO TABS: 800.0000 mg | ORAL_TABLET | Freq: Three times a day (TID) | ORAL | Status: DC

## 2015-01-11 NOTE — Discharge Instructions (Signed)
You have been seen for a lump on your back. You are advised to follow up with a general surgeon. Follow up with PCP as needed. Return to ED should symptoms worsen.   Lipoma A lipoma is a noncancerous (benign) tumor that is made up of fat cells. This is a very common type of soft-tissue growth. Lipomas are usually found under the skin (subcutaneous). They may occur in any tissue of the body that contains fat. Common areas for lipomas to appear include the back, shoulders, buttocks, and thighs. Lipomas grow slowly, and they are usually painless. Most lipomas do not cause problems and do not require treatment. CAUSES The cause of this condition is not known. RISK FACTORS This condition is more likely to develop in:  People who are 87-48 years old.  People who have a family history of lipomas. SYMPTOMS A lipoma usually appears as a small, round bump under the skin. It may feel soft or rubbery, but the firmness can vary. Most lipomas are not painful. However, a lipoma may become painful if it is located in an area where it pushes on nerves. DIAGNOSIS A lipoma can usually be diagnosed with a physical exam. You may also have tests to confirm the diagnosis and to rule out other conditions. Tests may include:  Imaging tests, such as a CT scan or MRI.  Removal of a tissue sample to be looked at under a microscope (biopsy). TREATMENT Treatment is not needed for small lipomas that are not causing problems. If a lipoma continues to get bigger or it causes problems, removal is often the best option. Lipomas can also be removed to improve appearance. Removal of a lipoma is usually done with a surgery in which the fatty cells and the surrounding capsule are removed. Most often, a medicine that numbs the area (local anesthetic) is used for this procedure. HOME CARE INSTRUCTIONS  Keep all follow-up visits as directed by your health care provider. This is important. SEEK MEDICAL CARE IF:  Your lipoma  becomes larger or hard.  Your lipoma becomes painful, red, or increasingly swollen. These could be signs of infection or a more serious condition.   This information is not intended to replace advice given to you by your health care provider. Make sure you discuss any questions you have with your health care provider.   Document Released: 02/24/2002 Document Revised: 07/21/2014 Document Reviewed: 03/02/2014 Elsevier Interactive Patient Education [employer].

## 2015-01-11 NOTE — ED Provider Notes (Signed)
CSN: [id number]     Arrival date & time 01/11/15  1103 History   First MD Initiated Contact with Patient 01/11/15 1502     Chief Complaint  Patient presents with  . Cyst  . Neck Pain     (Consider location/radiation/quality/duration/timing/severity/associated sxs/prior Treatment) HPI   Julian Clark is a 34 y.o. male, presents with a large cyst on his upper left back. Pt states he has had a cyst there for years, but it was about the size of a dime. A couple months ago, it began to grow and is now the size of a softball. Pt he started have pain in the area, 8/10, achy, radiating into his left neck. Pt denies fever/chills, N/V, shortness of breath, nigh sweats, or any other accompanying complaints.  Pt also has another cyst above the large one that is about the size of a dime.  Past Medical History  Diagnosis Date  . WPW (Wolff-Parkinson-White syndrome)    History reviewed. No pertinent past surgical history. No family history on file. Social History  Substance Use Topics  . Smoking status: Current Some Day Smoker    Last Attempt to Quit: 09/18/2011  . Smokeless tobacco: None  . Alcohol Use: No    Review of Systems  Constitutional: Negative for fever and chills.  Respiratory: Negative for cough and shortness of breath.   Cardiovascular: Negative for chest pain.  Gastrointestinal: Negative for nausea and vomiting.  Musculoskeletal: Positive for myalgias.  Skin:       Lump on upper left back      Allergies  Penicillins  Home Medications   Prior to Admission medications   Medication Sig Start Date End Date Taking? Authorizing Provider  naproxen sodium (ALEVE) 220 MG tablet Take 220 mg by mouth 2 (two) times daily with a meal. pain   Yes Historical Provider, MD  ibuprofen (ADVIL,MOTRIN) 800 MG tablet Take 1 tablet (800 mg total) by mouth 3 (three) times daily. 01/11/15   Levette Paulick C Vernona Peake, PA-C   BP 121/68 mmHg  Pulse 77  Temp(Src) 98.3 F (36.8 C) (Oral)  Resp 12   SpO2 100% Physical Exam  Constitutional: He appears well-developed and well-nourished.  Eyes: Conjunctivae are normal.  Neck: Normal range of motion.  Cardiovascular: Normal rate and regular rhythm.   Pulmonary/Chest: Effort normal and breath sounds normal.  Lymphadenopathy:    He has no cervical adenopathy.  Neurological: He is alert.  Skin: Skin is warm and dry.  Softball size suspected lipoma on upper left back. Fluctuance noted. No erythema or extra warmth.   Nursing note and vitals reviewed.   ED Course  .Marland KitchenIncision and Drainage Date/Time: 01/11/2015 4:01 PM Performed by: Lorayne Bender Authorized by: Arlean Hopping C Consent: Verbal consent obtained. Risks and benefits: risks, benefits and alternatives were discussed Consent given by: patient Patient understanding: patient states understanding of the procedure being performed Patient consent: the patient's understanding of the procedure matches consent given Procedure consent: procedure consent matches procedure scheduled Site marked: the operative site was marked Patient identity confirmed: verbally with patient and arm band Time out: Immediately prior to procedure a "time out" was called to verify the correct patient, procedure, equipment, support staff and site/side marked as required. Type: cyst Body area: trunk Location details: back Patient sedated: no Needle gauge: 18 Complexity: simple Patient tolerance: Patient tolerated the procedure well with no immediate complications Comments: No fluid able to be drained.    (including critical care time) Labs Review Labs Reviewed -  No data to display  Imaging Review No results found. I have personally reviewed and evaluated these images and lab results as part of my medical decision-making.   EKG Interpretation None      MDM   Final diagnoses:  Lipoma of back    Julian Clark presents with a large cyst on his upper left back.  Findings and plan of care  discussed with Leonard Schwartz, MD   Suspect sebaceous cyst vs lipoma. Inspected with bedside ultrasound. Minimal fluid under surface, mostly adipose tissue. Will try to drain, then discharged pt with pain management and referral to general surgeon. No fluid obtained from cyst. Minimal bleeding.     Lorayne Bender, PA-C 01/11/15 1640  Leonard Schwartz, MD 01/14/15 [phone]

## 2015-01-11 NOTE — ED Notes (Signed)
Pt placed on monitor. Suture cart at bedside.

## 2015-01-11 NOTE — ED Notes (Signed)
Pt is here with cyst on left upper back under scapula for 10 years and pt states it has become considerably larger.  Pt now with pain that radiates to neck and is having problems moving neck.  Pt states that he cannot touch chin to chest.

## 2015-01-11 NOTE — ED Notes (Signed)
Betadine, 18g needle and 20 cc syringe requested by physician, placed at bedside.

## 2015-02-02 ENCOUNTER — Encounter (HOSPITAL_BASED_OUTPATIENT_CLINIC_OR_DEPARTMENT_OTHER): Payer: Self-pay | Admitting: *Deleted

## 2015-02-02 ENCOUNTER — Ambulatory Visit: Payer: Self-pay | Admitting: General Surgery

## 2015-02-02 NOTE — H&P (Signed)
History of Present Illness Julian Hollingshead MD; 01/25/2015 2:46 PM) Patient words: lipoma.  The patient is a 34 year old male.  Note:He is referred by Arlean Hopping, PA from the ED because of an enlarging soft tissue mass in the left upper back. This has been present for many years but over the past 2 months has been enlarging and become uncomfortable at times. It is difficult to sleep on his back. Superior and lateral to this is also a cystic lesion that intermittently drains and enlarges. He went to the emergency department an ultrasound was consistent with lipoma. Aspiration was performed and there was no purulent fluid.  Other Problems Jeralyn Ruths, [place]; 01/25/2015 2:21 PM) No pertinent past medical history  Past Surgical History Jeralyn Ruths, [place]; 01/25/2015 2:21 PM) No pertinent past surgical history  Diagnostic Studies History Jeralyn Ruths, [place]; 01/25/2015 2:21 PM) Colonoscopy never  Allergies Jearld Fenton Morris, CMA; 01/25/2015 2:22 PM) Penicillin G Benzathine & Proc *PENICILLINS* Hives, Itching.  Medication History Jeralyn Ruths, [place]; 01/25/2015 2:21 PM) Medications Reconciled  Social History Jeralyn Ruths, [place]; 01/25/2015 2:21 PM) Alcohol use Occasional alcohol use. Caffeine use Carbonated beverages, Coffee, Tea. Illicit drug use Recently quit drug use. Tobacco use Current some day smoker.  Family History Jeralyn Ruths, [place]; 01/25/2015 2:21 PM) Family history unknown First Degree Relatives    Review of Systems Jearld Fenton Morris CMA; 01/25/2015 2:21 PM) General Not Present- Appetite Loss, Chills, Fatigue, Fever, Night Sweats, Weight Gain and Weight Loss. Skin Present- Dryness. Not Present- Change in Wart/Mole, Hives, Jaundice, New Lesions, Non-Healing Wounds, Rash and Ulcer. HEENT Present- Wears glasses/contact lenses. Not Present- Earache, Hearing Loss, Hoarseness, Nose Bleed, Oral Ulcers, Ringing in the Ears, Seasonal Allergies, Sinus Pain, Sore Throat, Visual  Disturbances and Yellow Eyes. Respiratory Not Present- Bloody sputum, Chronic Cough, Difficulty Breathing, Snoring and Wheezing. Breast Not Present- Breast Mass, Breast Pain, Nipple Discharge and Skin Changes. Cardiovascular Not Present- Chest Pain, Difficulty Breathing Lying Down, Leg Cramps, Palpitations, Rapid Heart Rate, Shortness of Breath and Swelling of Extremities. Gastrointestinal Not Present- Abdominal Pain, Bloating, Bloody Stool, Change in Bowel Habits, Chronic diarrhea, Constipation, Difficulty Swallowing, Excessive gas, Gets full quickly at meals, Hemorrhoids, Indigestion, Nausea, Rectal Pain and Vomiting. Musculoskeletal Present- Back Pain and Muscle Pain. Not Present- Joint Pain, Joint Stiffness, Muscle Weakness and Swelling of Extremities. Neurological Not Present- Decreased Memory, Fainting, Headaches, Numbness, Seizures, Tingling, Tremor, Trouble walking and Weakness.  Physical Exam Julian Hollingshead MD; 01/25/2015 2:48 PM) The physical exam findings are as follows: Note:General-well-developed well-nourished male in no acute distress.  Muscular skeletal-in the left mid to upper back there is a 12 cm x 11 cm subcutaneous soft tissue mass that is partially mobile. Superior and lateral to this is a 1.5 cm cystic type mass.  Neurologic-he is alert and oriented and answers questions appropriately.    Assessment & Plan Julian Hollingshead MD; 01/25/2015 2:45 PM) LIPOMA OF BACK (D17.1) Impression: This has been enlarging and is now becoming symptomatic with specific activities. Clinically, it appears to be consistent with a lipoma although final pathology will allow for definitive diagnosis.  Plan: Removal under general anesthesia and removal of intimately draining cyst superior and lateral to this. We've gone over the procedure rationale and risks. Risks include but are not limited to bleeding, infection, wound problems, anesthesia, recurrence, seroma formation. We also talked  about after care. He seems to understand this and would like to proceed.  Jackolyn Confer, MD

## 2015-02-02 NOTE — Progress Notes (Addendum)
SPOKE W/ FIANCE, ELIZABETH HIATT.  NPO AFTER MN, VERBALIZED UNDERSTANDING THIS INCLUDES NO DIP TOBACCO.   ARRIVE AT 0830.  NEEDS CBC W/ DIFF AND BMET AND EKG.    Asked about where to get old ekg was told [facility] should have one.  There is not one in epic and called ekg department and there is not one in the system.

## 2015-02-04 ENCOUNTER — Ambulatory Visit (HOSPITAL_BASED_OUTPATIENT_CLINIC_OR_DEPARTMENT_OTHER)
Admission: RE | Admit: 2015-02-04 | Discharge: 2015-02-04 | Disposition: A | Discharge status: Home / Self Care | Payer: Managed Care, Other (non HMO) | Source: Ambulatory Visit | Attending: General Surgery | Admitting: General Surgery

## 2015-02-04 ENCOUNTER — Encounter (HOSPITAL_BASED_OUTPATIENT_CLINIC_OR_DEPARTMENT_OTHER): Payer: Self-pay

## 2015-02-04 ENCOUNTER — Ambulatory Visit (HOSPITAL_BASED_OUTPATIENT_CLINIC_OR_DEPARTMENT_OTHER): Payer: Managed Care, Other (non HMO) | Admitting: Anesthesiology

## 2015-02-04 ENCOUNTER — Encounter (HOSPITAL_BASED_OUTPATIENT_CLINIC_OR_DEPARTMENT_OTHER)
Admission: RE | Disposition: A | Discharge status: Home / Self Care | Payer: Self-pay | Source: Ambulatory Visit | Attending: General Surgery

## 2015-02-04 ENCOUNTER — Other Ambulatory Visit: Payer: Self-pay

## 2015-02-04 DIAGNOSIS — L720 Epidermal cyst: Secondary | ICD-10-CM | POA: Insufficient documentation

## 2015-02-04 DIAGNOSIS — F17200 Nicotine dependence, unspecified, uncomplicated: Secondary | ICD-10-CM | POA: Diagnosis not present

## 2015-02-04 DIAGNOSIS — D171 Benign lipomatous neoplasm of skin and subcutaneous tissue of trunk: Secondary | ICD-10-CM | POA: Diagnosis not present

## 2015-02-04 DIAGNOSIS — R229 Localized swelling, mass and lump, unspecified: Secondary | ICD-10-CM | POA: Diagnosis present

## 2015-02-04 HISTORY — DX: Presence of spectacles and contact lenses: Z97.3

## 2015-02-04 HISTORY — PX: MASS EXCISION: SHX2000

## 2015-02-04 HISTORY — DX: Other specified soft tissue disorders: M79.89

## 2015-02-04 HISTORY — DX: Other specified acute skin changes due to ultraviolet radiation: L56.8

## 2015-02-04 LAB — BASIC METABOLIC PANEL
ANION GAP: 9 (ref 5–15)
BUN: 11 mg/dL (ref 6–20)
CALCIUM: 9.1 mg/dL (ref 8.9–10.3)
CO2: 25 mmol/L (ref 22–32)
CREATININE: 0.88 mg/dL (ref 0.61–1.24)
Chloride: 105 mmol/L (ref 101–111)
Glucose, Bld: 88 mg/dL (ref 65–99)
Potassium: 4.1 mmol/L (ref 3.5–5.1)
SODIUM: 139 mmol/L (ref 135–145)

## 2015-02-04 LAB — CBC WITH DIFFERENTIAL/PLATELET
BASOS ABS: 0.1 10*3/uL (ref 0.0–0.1)
BASOS PCT: 2 %
EOS ABS: 0.1 10*3/uL (ref 0.0–0.7)
Eosinophils Relative: 2 %
HCT: 41.3 % (ref 39.0–52.0)
HEMOGLOBIN: 13.5 g/dL (ref 13.0–17.0)
Lymphocytes Relative: 29 %
Lymphs Abs: 2.4 10*3/uL (ref 0.7–4.0)
MCH: 28.1 pg (ref 26.0–34.0)
MCHC: 32.7 g/dL (ref 30.0–36.0)
MCV: 85.9 fL (ref 78.0–100.0)
MONOS PCT: 7 %
Monocytes Absolute: 0.6 10*3/uL (ref 0.1–1.0)
NEUTROS ABS: 5 10*3/uL (ref 1.7–7.7)
NEUTROS PCT: 60 %
Platelets: 257 10*3/uL (ref 150–400)
RBC: 4.81 MIL/uL (ref 4.22–5.81)
RDW: 12.8 % (ref 11.5–15.5)
WBC: 8.1 10*3/uL (ref 4.0–10.5)

## 2015-02-04 SURGERY — EXCISION MASS
Anesthesia: General | Site: Back

## 2015-02-04 MED ORDER — DEXAMETHASONE SODIUM PHOSPHATE 4 MG/ML IJ SOLN
INTRAMUSCULAR | Status: DC | PRN
  Administered 2015-02-04: 10 mg via INTRAVENOUS

## 2015-02-04 MED ORDER — SUCCINYLCHOLINE CHLORIDE 20 MG/ML IJ SOLN
INTRAMUSCULAR | Status: DC | PRN
  Administered 2015-02-04: 100 mg via INTRAVENOUS

## 2015-02-04 MED ORDER — FENTANYL CITRATE (PF) 100 MCG/2ML IJ SOLN
INTRAMUSCULAR | Status: DC | PRN
  Administered 2015-02-04: 25 ug via INTRAVENOUS
  Administered 2015-02-04 (×2): 50 ug via INTRAVENOUS
  Administered 2015-02-04 (×3): 25 ug via INTRAVENOUS

## 2015-02-04 MED ORDER — MIDAZOLAM HCL 5 MG/5ML IJ SOLN
INTRAMUSCULAR | Status: DC | PRN
  Administered 2015-02-04: 2 mg via INTRAVENOUS

## 2015-02-04 MED ORDER — SODIUM BICARBONATE 4 % IV SOLN
INTRAVENOUS | Status: AC
  Filled 2015-02-04: qty 5

## 2015-02-04 MED ORDER — LIDOCAINE HCL (CARDIAC) 20 MG/ML IV SOLN
INTRAVENOUS | Status: DC | PRN
  Administered 2015-02-04: 100 mg via INTRAVENOUS

## 2015-02-04 MED ORDER — OXYCODONE HCL 5 MG PO TABS
5.0000 mg | ORAL_TABLET | ORAL | Status: DC | PRN
  Filled 2015-02-04: qty 2

## 2015-02-04 MED ORDER — LACTATED RINGERS IV SOLN
INTRAVENOUS | Status: DC
  Administered 2015-02-04 (×3): via INTRAVENOUS
  Filled 2015-02-04: qty 1000

## 2015-02-04 MED ORDER — ACETAMINOPHEN 650 MG RE SUPP
650.0000 mg | RECTAL | Status: DC | PRN
  Filled 2015-02-04: qty 1

## 2015-02-04 MED ORDER — KETOROLAC TROMETHAMINE 30 MG/ML IJ SOLN
INTRAMUSCULAR | Status: AC
  Filled 2015-02-04: qty 1

## 2015-02-04 MED ORDER — ONDANSETRON HCL 4 MG/2ML IJ SOLN
INTRAMUSCULAR | Status: DC | PRN
  Administered 2015-02-04: 4 mg via INTRAVENOUS

## 2015-02-04 MED ORDER — BUPIVACAINE-EPINEPHRINE 0.5% -1:200000 IJ SOLN
INTRAMUSCULAR | Status: DC | PRN
  Administered 2015-02-04: 22 mL

## 2015-02-04 MED ORDER — BUPIVACAINE-EPINEPHRINE (PF) 0.5% -1:200000 IJ SOLN
INTRAMUSCULAR | Status: AC
  Filled 2015-02-04: qty 30

## 2015-02-04 MED ORDER — BUPIVACAINE HCL (PF) 0.5 % IJ SOLN
INTRAMUSCULAR | Status: AC
  Filled 2015-02-04: qty 30

## 2015-02-04 MED ORDER — FENTANYL CITRATE (PF) 100 MCG/2ML IJ SOLN
25.0000 ug | INTRAMUSCULAR | Status: DC | PRN
  Filled 2015-02-04: qty 1

## 2015-02-04 MED ORDER — FENTANYL CITRATE (PF) 100 MCG/2ML IJ SOLN
INTRAMUSCULAR | Status: AC
  Filled 2015-02-04: qty 2

## 2015-02-04 MED ORDER — KETOROLAC TROMETHAMINE 30 MG/ML IJ SOLN
INTRAMUSCULAR | Status: DC | PRN
  Administered 2015-02-04: 30 mg via INTRAVENOUS

## 2015-02-04 MED ORDER — PROPOFOL 500 MG/50ML IV EMUL
INTRAVENOUS | Status: AC
  Filled 2015-02-04: qty 50

## 2015-02-04 MED ORDER — PROPOFOL 10 MG/ML IV BOLUS
INTRAVENOUS | Status: DC | PRN
  Administered 2015-02-04: 200 mg via INTRAVENOUS

## 2015-02-04 MED ORDER — LACTATED RINGERS IV SOLN
INTRAVENOUS | Status: DC
  Filled 2015-02-04: qty 1000

## 2015-02-04 MED ORDER — ACETAMINOPHEN 325 MG PO TABS
650.0000 mg | ORAL_TABLET | ORAL | Status: DC | PRN
  Filled 2015-02-04: qty 2

## 2015-02-04 MED ORDER — LIDOCAINE-EPINEPHRINE 1 %-1:100000 IJ SOLN
INTRAMUSCULAR | Status: AC
  Filled 2015-02-04: qty 1

## 2015-02-04 MED ORDER — VANCOMYCIN HCL IN DEXTROSE 1-5 GM/200ML-% IV SOLN
1000.0000 mg | INTRAVENOUS | Status: AC
  Administered 2015-02-04: 1000 mg via INTRAVENOUS
  Filled 2015-02-04: qty 200

## 2015-02-04 MED ORDER — ONDANSETRON HCL 4 MG/2ML IJ SOLN
INTRAMUSCULAR | Status: AC
Start: 2015-02-04 — End: 2015-02-04
  Filled 2015-02-04: qty 2

## 2015-02-04 MED ORDER — DEXAMETHASONE SODIUM PHOSPHATE 10 MG/ML IJ SOLN
INTRAMUSCULAR | Status: AC
Start: 2015-02-04 — End: 2015-02-04
  Filled 2015-02-04: qty 1

## 2015-02-04 MED ORDER — SODIUM CHLORIDE 0.9 % IJ SOLN
3.0000 mL | INTRAMUSCULAR | Status: DC | PRN
  Filled 2015-02-04: qty 3

## 2015-02-04 MED ORDER — MIDAZOLAM HCL 2 MG/2ML IJ SOLN
INTRAMUSCULAR | Status: AC
  Filled 2015-02-04: qty 2

## 2015-02-04 MED ORDER — LIDOCAINE HCL (CARDIAC) 20 MG/ML IV SOLN
INTRAVENOUS | Status: AC
  Filled 2015-02-04: qty 5

## 2015-02-04 MED ORDER — HYDROCODONE-ACETAMINOPHEN 5-325 MG PO TABS: 1.0000 | ORAL_TABLET | ORAL | Status: DC | PRN

## 2015-02-04 MED ORDER — MORPHINE SULFATE (PF) 2 MG/ML IV SOLN
2.0000 mg | INTRAVENOUS | Status: DC | PRN
  Filled 2015-02-04: qty 2

## 2015-02-04 SURGICAL SUPPLY — 43 items
APL SKNCLS STERI-STRIP NONHPOA (GAUZE/BANDAGES/DRESSINGS) ×1
BENZOIN TINCTURE PRP APPL 2/3 (GAUZE/BANDAGES/DRESSINGS) ×3 IMPLANT
BLADE SURG 10 STRL SS (BLADE) ×3 IMPLANT
BLADE SURG 15 STRL LF DISP TIS (BLADE) ×1 IMPLANT
BLADE SURG 15 STRL SS (BLADE) ×2
CHLORAPREP W/TINT 26ML (MISCELLANEOUS) ×3 IMPLANT
CLEANER CAUTERY TIP 5X5 PAD (MISCELLANEOUS) ×1 IMPLANT
CLOSURE WOUND 1/2 X4 (GAUZE/BANDAGES/DRESSINGS)
COVER BACK TABLE 60X90IN (DRAPES) ×3 IMPLANT
COVER MAYO STAND STRL (DRAPES) ×3 IMPLANT
DECANTER SPIKE VIAL GLASS SM (MISCELLANEOUS) IMPLANT
DRAPE PED LAPAROTOMY (DRAPES) ×3 IMPLANT
DRSG TEGADERM 2-3/8X2-3/4 SM (GAUZE/BANDAGES/DRESSINGS) ×3 IMPLANT
DRSG TEGADERM 4X4.75 (GAUZE/BANDAGES/DRESSINGS) ×6 IMPLANT
ELECT REM PT RETURN 9FT ADLT (ELECTROSURGICAL) ×3
ELECTRODE REM PT RTRN 9FT ADLT (ELECTROSURGICAL) ×1 IMPLANT
GAUZE SPONGE 4X4 16PLY XRAY LF (GAUZE/BANDAGES/DRESSINGS) IMPLANT
GLOVE BIOGEL PI IND STRL 8.5 (GLOVE) ×1 IMPLANT
GLOVE BIOGEL PI INDICATOR 8.5 (GLOVE) ×2
GLOVE ECLIPSE 8.0 STRL XLNG CF (GLOVE) ×3 IMPLANT
GOWN STRL REUS W/ TWL LRG LVL3 (GOWN DISPOSABLE) ×2 IMPLANT
GOWN STRL REUS W/TWL LRG LVL3 (GOWN DISPOSABLE) ×6
LIQUID BAND (GAUZE/BANDAGES/DRESSINGS) ×3 IMPLANT
NEEDLE HYPO 25X1 1.5 SAFETY (NEEDLE) ×3 IMPLANT
NS IRRIG 500ML POUR BTL (IV SOLUTION) ×3 IMPLANT
PACK BASIN DAY SURGERY FS (CUSTOM PROCEDURE TRAY) ×3 IMPLANT
PAD CLEANER CAUTERY TIP 5X5 (MISCELLANEOUS) ×2
PENCIL BUTTON HOLSTER BLD 10FT (ELECTRODE) ×3 IMPLANT
SPONGE GAUZE 4X4 12PLY STER LF (GAUZE/BANDAGES/DRESSINGS) ×3 IMPLANT
STRIP CLOSURE SKIN 1/2X4 (GAUZE/BANDAGES/DRESSINGS) IMPLANT
SUT MNCRL AB 3-0 PS2 18 (SUTURE) ×6 IMPLANT
SUT MON AB 4-0 PC3 18 (SUTURE) IMPLANT
SUT PROLENE 2 0 CT2 30 (SUTURE) IMPLANT
SUT VIC AB 2-0 SH 18 (SUTURE) ×3 IMPLANT
SUT VIC AB 4-0 SH 18 (SUTURE) IMPLANT
SUT VIC AB 4-0 SH 27 (SUTURE)
SUT VIC AB 4-0 SH 27XANBCTRL (SUTURE) IMPLANT
SYR CONTROL 10ML LL (SYRINGE) ×3 IMPLANT
TAPE STRIPS DRAPE STRL (GAUZE/BANDAGES/DRESSINGS) ×3 IMPLANT
TOWEL OR 17X24 6PK STRL BLUE (TOWEL DISPOSABLE) ×6 IMPLANT
TUBE CONNECTING 12'X1/4 (SUCTIONS)
TUBE CONNECTING 12X1/4 (SUCTIONS) IMPLANT
YANKAUER SUCT BULB TIP NO VENT (SUCTIONS) IMPLANT

## 2015-02-04 NOTE — Anesthesia Postprocedure Evaluation (Signed)
  Anesthesia Post-op Note  Patient: Julian Clark  Procedure(s) Performed: Procedure(s) (LRB): REMOVAL SOFT TISSUE  MASSES ON BACK X 2 (N/A)  Patient Location: PACU  Anesthesia Type: General  Level of Consciousness: awake and alert   Airway and Oxygen Therapy: Patient Spontanous Breathing  Post-op Pain: mild  Post-op Assessment: Post-op Vital signs reviewed, Patient's Cardiovascular Status Stable, Respiratory Function Stable, Patent Airway and No signs of Nausea or vomiting  Last Vitals:  Filed Vitals:   02/04/15 1250  BP: 132/81  Pulse: 64  Temp:   Resp: 18    Post-op Vital Signs: stable   Complications: No apparent anesthesia complications

## 2015-02-04 NOTE — Interval H&P Note (Signed)
History and Physical Interval Note:  02/04/2015 10:31 AM  Julian Clark  has presented today for surgery, with the diagnosis of enlarged soft tissue masse on back  The various methods of treatment have been discussed with the patient and family. After consideration of risks, benefits and other options for treatment, the patient has consented to  Procedure(s): REMOVAL SOFT TISSUE  MASSES ON BACK X 2 (N/A) as a surgical intervention .  The patient's history has been reviewed, patient examined, no change in status, stable for surgery.  I have reviewed the patient's chart and labs.  Questions were answered to the patient's satisfaction.     Christel Bai Lenna Sciara

## 2015-02-04 NOTE — Anesthesia Preprocedure Evaluation (Signed)
Anesthesia Evaluation  Patient identified by MRN, date of birth, ID band Patient awake    Reviewed: Allergy & Precautions, H&P , NPO status , Patient's Chart, lab work & pertinent test results  Airway Mallampati: II  TM Distance: >3 FB Neck ROM: full    Dental no notable dental hx. (+) Dental Advisory Given, Teeth Intact   Pulmonary neg pulmonary ROS, Current Smoker,    Pulmonary exam normal breath sounds clear to auscultation       Cardiovascular Exercise Tolerance: Good negative cardio ROS Normal cardiovascular exam Rhythm:regular Rate:Normal  WPW syndrome as a teen. ECG no early repolarization   Neuro/Psych negative neurological ROS  negative psych ROS   GI/Hepatic negative GI ROS, Neg liver ROS,   Endo/Other  negative endocrine ROS  Renal/GU negative Renal ROS  negative genitourinary   Musculoskeletal   Abdominal   Peds  Hematology negative hematology ROS (+)   Anesthesia Other Findings   Reproductive/Obstetrics negative OB ROS                             Anesthesia Physical Anesthesia Plan  ASA: II  Anesthesia Plan: General   Post-op Pain Management:    Induction: Intravenous  Airway Management Planned: Oral ETT  Additional Equipment:   Intra-op Plan:   Post-operative Plan: Extubation in OR  Informed Consent: I have reviewed the patients History and Physical, chart, labs and discussed the procedure including the risks, benefits and alternatives for the proposed anesthesia with the patient or authorized representative who has indicated his/her understanding and acceptance.   Dental Advisory Given  Plan Discussed with: CRNA and Surgeon  Anesthesia Plan Comments:         Anesthesia Quick Evaluation

## 2015-02-04 NOTE — Op Note (Signed)
Operative Note  Julian Clark male 34 y.o. 02/04/2015  PREOPERATIVE DX:  Enlarging soft tissue mass left mid back, intermittently draining soft tissue mass left upper back  POSTOPERATIVE DX:  Same  PROCEDURE:   1. Excision of 12 cm x 9 cm soft tissue subcutaneous mass left mid back. 2. Excision of 1.2 cm cystic subcutaneous mass left upper back.         Surgeon: Odis Hollingshead   Assistants: none  Anesthesia: General endotracheal anesthesia  Indications:   This is a 34 year old male with an intermittently draining left upper back soft tissue subcutaneous mass and an enlarging, symptomatic left mid back soft tissue mass. He now presents for excision.    Procedure Detail:  He was seen in the holding area. He was brought to the operating room supine on the stretcher and given a general anesthetic. He was then turned prone on the operating table and padded appropriately. The left back was then sterilely prepped and draped. A timeout was performed.  Local anesthetic consisting of half percent Marcaine was infiltrated in the skin and dermis over the large left mid back lesion. An incision was made through the skin and the dermis and carried down to the subcutaneous tissues. Beginning medially, I was able to identify a plane between the fascia and the soft tissue mass which appeared to be grossly consistent with a lipoma. Using blunt soft sharp dissection I separated thisand continued the dissection laterally using electrocautery and blunt dissection. In this way, I was able to remove the mass. The mass was completely superior to the fascia. The mesh measured 12 cm x 9 cm. It was sent to pathology.  The wound was inspected and hemostasis was controlled electrocautery. Local anesthetic was infiltrated into the deep aspects of the wound. The wound was then closed by approximating the subcutaneous tissue with interrupted 2-0 Vicryl sutures. The skin was closed with a running 3-0 Monocryl  subcuticular stitch.  Next the smaller mass of left upper back was approached an elliptical incision was made through the skin and dermis. This appeared to be cystic in nature. I removed this using sharp dissection and sent it to pathology. Bleeding was controlled electrocautery. The wound was closed by approximating the subcutaneous tissue with interrupted 3-0 Monocryl sutures. The skin was then closed with interrupted 3-0 Monocryl subcuticular stitches. Steri-Strips and sterile dressings were applied to both wounds.  He tolerated procedures well without apparent complications. He was then extubated and brought to the recovery room in satisfactory condition. There was minimal blood loss.

## 2015-02-04 NOTE — Transfer of Care (Signed)
Immediate Anesthesia Transfer of Care Note  Patient: Julian Clark  Procedure(s) Performed: Procedure(s) (LRB): REMOVAL SOFT TISSUE  MASSES ON BACK X 2 (N/A)  Patient Location: PACU  Anesthesia Type: General  Level of Consciousness: awake, sedated, patient cooperative and responds to stimulation  Airway & Oxygen Therapy: Patient Spontanous Breathing and Patient connected to face mask oxygen  Post-op Assessment: Report given to PACU RN, Post -op Vital signs reviewed and stable and Patient moving all extremities  Post vital signs: Reviewed and stable  Complications: No apparent anesthesia complications

## 2015-02-04 NOTE — H&P (View-Only) (Signed)
History of Present Illness Julian Hollingshead MD; 01/25/2015 2:46 PM) Patient words: lipoma.  The patient is a 34 year old male.  Note:He is referred by Arlean Hopping, PA from the ED because of an enlarging soft tissue mass in the left upper back. This has been present for many years but over the past 2 months has been enlarging and become uncomfortable at times. It is difficult to sleep on his back. Superior and lateral to this is also a cystic lesion that intermittently drains and enlarges. He went to the emergency department an ultrasound was consistent with lipoma. Aspiration was performed and there was no purulent fluid.  Other Problems Jeralyn Ruths, [place]; 01/25/2015 2:21 PM) No pertinent past medical history  Past Surgical History Jeralyn Ruths, [place]; 01/25/2015 2:21 PM) No pertinent past surgical history  Diagnostic Studies History Jeralyn Ruths, [place]; 01/25/2015 2:21 PM) Colonoscopy never  Allergies Jearld Fenton Morris, CMA; 01/25/2015 2:22 PM) Penicillin G Benzathine & Proc *PENICILLINS* Hives, Itching.  Medication History Jeralyn Ruths, [place]; 01/25/2015 2:21 PM) Medications Reconciled  Social History Jeralyn Ruths, [place]; 01/25/2015 2:21 PM) Alcohol use Occasional alcohol use. Caffeine use Carbonated beverages, Coffee, Tea. Illicit drug use Recently quit drug use. Tobacco use Current some day smoker.  Family History Jeralyn Ruths, [place]; 01/25/2015 2:21 PM) Family history unknown First Degree Relatives    Review of Systems Jearld Fenton Morris CMA; 01/25/2015 2:21 PM) General Not Present- Appetite Loss, Chills, Fatigue, Fever, Night Sweats, Weight Gain and Weight Loss. Skin Present- Dryness. Not Present- Change in Wart/Mole, Hives, Jaundice, New Lesions, Non-Healing Wounds, Rash and Ulcer. HEENT Present- Wears glasses/contact lenses. Not Present- Earache, Hearing Loss, Hoarseness, Nose Bleed, Oral Ulcers, Ringing in the Ears, Seasonal Allergies, Sinus Pain, Sore Throat, Visual  Disturbances and Yellow Eyes. Respiratory Not Present- Bloody sputum, Chronic Cough, Difficulty Breathing, Snoring and Wheezing. Breast Not Present- Breast Mass, Breast Pain, Nipple Discharge and Skin Changes. Cardiovascular Not Present- Chest Pain, Difficulty Breathing Lying Down, Leg Cramps, Palpitations, Rapid Heart Rate, Shortness of Breath and Swelling of Extremities. Gastrointestinal Not Present- Abdominal Pain, Bloating, Bloody Stool, Change in Bowel Habits, Chronic diarrhea, Constipation, Difficulty Swallowing, Excessive gas, Gets full quickly at meals, Hemorrhoids, Indigestion, Nausea, Rectal Pain and Vomiting. Musculoskeletal Present- Back Pain and Muscle Pain. Not Present- Joint Pain, Joint Stiffness, Muscle Weakness and Swelling of Extremities. Neurological Not Present- Decreased Memory, Fainting, Headaches, Numbness, Seizures, Tingling, Tremor, Trouble walking and Weakness.  Physical Exam Julian Hollingshead MD; 01/25/2015 2:48 PM) The physical exam findings are as follows: Note:General-well-developed well-nourished male in no acute distress.  Muscular skeletal-in the left mid to upper back there is a 12 cm x 11 cm subcutaneous soft tissue mass that is partially mobile. Superior and lateral to this is a 1.5 cm cystic type mass.  Neurologic-he is alert and oriented and answers questions appropriately.    Assessment & Plan Julian Hollingshead MD; 01/25/2015 2:45 PM) LIPOMA OF BACK (D17.1) Impression: This has been enlarging and is now becoming symptomatic with specific activities. Clinically, it appears to be consistent with a lipoma although final pathology will allow for definitive diagnosis.  Plan: Removal under general anesthesia and removal of intimately draining cyst superior and lateral to this. We've gone over the procedure rationale and risks. Risks include but are not limited to bleeding, infection, wound problems, anesthesia, recurrence, seroma formation. We also talked  about after care. He seems to understand this and would like to proceed.  Jackolyn Confer, MD

## 2015-02-04 NOTE — Discharge Instructions (Addendum)
Light activities for one week, especially with your left arm.  Apply ice to the area for next 3 days.  May shower tomorrow, remove bandage in 3 days.  Call for heavy bleeding or wound problems.Call your surgeon if you experience:   1.  Fever over 101.0. 2.  Nausea and/or vomiting. 3.  Extreme swelling or bruising at the surgical site. 4.  Continued bleeding from the incision. 5  Increased pain, redness or drainage from the incision. 6.  Problems related to your pain medication. 7.  Call for any problems/ concerns.  Post Anesthesia Home Care Instructions  Activity: Get plenty of rest for the remainder of the day. A responsible adult should stay with you for 24 hours following the procedure.  For the next 24 hours, DO NOT: -Drive a car -Paediatric nurse -Drink alcoholic beverages -Take any medication unless instructed by your physician -Make any legal decisions or sign important papers.  Meals: Start with liquid foods such as gelatin or soup. Progress to regular foods as tolerated. Avoid greasy, spicy, heavy foods. If nausea and/or vomiting occur, drink only clear liquids until the nausea and/or vomiting subsides. Call your physician if vomiting continues.  Special Instructions/Symptoms: Your throat may feel dry or sore from the anesthesia or the breathing tube placed in your throat during surgery. If this causes discomfort, gargle with warm salt water. The discomfort should disappear within 24 hours.  If you had a scopolamine patch placed behind your ear for the management of post- operative nausea and/or vomiting:  1. The medication in the patch is effective for 72 hours, after which it should be removed.  Wrap patch in a tissue and discard in the trash. Wash hands thoroughly with soap and water. 2. You may remove the patch earlier than 72 hours if you experience unpleasant side effects which may include dry mouth, dizziness or visual disturbances. 3. Avoid touching the patch.  Wash your hands with soap and water after contact with the patch.

## 2015-02-04 NOTE — Anesthesia Procedure Notes (Signed)
Procedure Name: Intubation Date/Time: 02/04/2015 10:43 AM Performed by: Mechele Claude Pre-anesthesia Checklist: Patient identified, Emergency Drugs available, Suction available and Patient being monitored Patient Re-evaluated:Patient Re-evaluated prior to inductionOxygen Delivery Method: Circle System Utilized Preoxygenation: Pre-oxygenation with 100% oxygen Intubation Type: IV induction Ventilation: Mask ventilation without difficulty Laryngoscope Size: Mac and 4 Tube type: Oral Tube size: 8.0 mm Number of attempts: 1 Airway Equipment and Method: Stylet Placement Confirmation: ETT inserted through vocal cords under direct vision,  positive ETCO2 and breath sounds checked- equal and bilateral Secured at: 22 cm Tube secured with: Tape Dental Injury: Teeth and Oropharynx as per pre-operative assessment

## 2015-02-05 ENCOUNTER — Encounter (HOSPITAL_BASED_OUTPATIENT_CLINIC_OR_DEPARTMENT_OTHER): Payer: Self-pay | Admitting: General Surgery

## 2016-08-29 ENCOUNTER — Encounter (HOSPITAL_COMMUNITY): Payer: Self-pay

## 2016-08-29 ENCOUNTER — Emergency Department (HOSPITAL_COMMUNITY)
Admission: EM | Admit: 2016-08-29 | Discharge: 2016-08-29 | Disposition: A | Discharge status: Home / Self Care | Payer: BLUE CROSS/BLUE SHIELD | Attending: Emergency Medicine | Admitting: Emergency Medicine

## 2016-08-29 DIAGNOSIS — Z79899 Other long term (current) drug therapy: Secondary | ICD-10-CM | POA: Diagnosis not present

## 2016-08-29 DIAGNOSIS — F17210 Nicotine dependence, cigarettes, uncomplicated: Secondary | ICD-10-CM | POA: Diagnosis not present

## 2016-08-29 DIAGNOSIS — L03115 Cellulitis of right lower limb: Secondary | ICD-10-CM | POA: Diagnosis not present

## 2016-08-29 MED ORDER — CLINDAMYCIN HCL 300 MG PO CAPS
300.0000 mg | ORAL_CAPSULE | Freq: Once | ORAL | Status: AC
Start: 2016-08-29 — End: 2016-08-29
  Administered 2016-08-29: 300 mg via ORAL
  Filled 2016-08-29: qty 1

## 2016-08-29 MED ORDER — CLINDAMYCIN HCL 150 MG PO CAPS: 300.0000 mg | ORAL_CAPSULE | Freq: Four times a day (QID) | ORAL | 0 refills | Status: AC

## 2016-08-29 NOTE — Discharge Instructions (Signed)
Please take all of your antibiotics until finished!   You may develop abdominal discomfort or diarrhea from the antibiotic.  You may help offset this with probiotics which you can buy or get in yogurt. Do not eat  or take the probiotics until 2 hours after your antibiotic.   Apply warm compresses to the area for comfort. Take ibuprofen or Tylenol as needed for comfort. Follow-up with your primary are for reevaluation. Return to the ED if concerning signs or symptoms of worsening or spread of infection develop.

## 2016-08-29 NOTE — ED Provider Notes (Signed)
[facility] DEPT Provider Note   CSN: [id number] Arrival date & time: 08/29/16  1941  By signing my name below, I, Margit Banda, attest that this documentation has been prepared under the direction and in the presence of [facility], PA-C. Electronically Signed: Margit Banda, ED Scribe. 08/29/16. 9:00 PM.  History   Chief Complaint Chief Complaint  Patient presents with  . Insect Bite    HPI Julian Clark is a 36 y.o. male who presents to the Emergency Department complaining of a gradually worsening, unwitnessed insect bite to his right inner thigh which he noticed ~ 3:30 pm yesterday, 08/28/16. Pt reports warm water and hydrocortisone cream temporarily improved his sx. Around 10 am today, pt notes the area of redness and itchiness spread throughout his thigh and groin. He endorses constant tenderness to the area, worsened with palpation and movement. Pt states he spends a lot of time in wooded areas and removes ticks from himself frequently, most recently several weeks ago. Pt denies drainage, joint pain, fevers, and chills. Denies new shampoos, lotions, detergents, or soaps.  The history is provided by the patient. No language interpreter was used.    Past Medical History:  Diagnosis Date  . 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 Soft tissue mass    ENLARGED LEFT UPPER BACK-- SOFTBALL SIZE  . Wears contact lenses   . WPW (Wolff-Parkinson-White syndrome) no old ekg available with [place] called ekg department 02-02-2015   dx age 88--  states no issues or symptoms since teen yrs and was told would grow out of it    There are no active problems to display for this patient.   Past Surgical History:  Procedure Laterality Date  . MASS EXCISION N/A 02/04/2015   Procedure: REMOVAL SOFT TISSUE  MASSES ON BACK X 2;  Surgeon: Jackolyn Confer, MD;  Location: [facility];  Service: General;  Laterality: N/A;  . NO PAST SURGERIES         Home  Medications    Prior to Admission medications   Medication Sig Start Date End Date Taking? Authorizing Provider  clindamycin (CLEOCIN) 150 MG capsule Take 2 capsules (300 mg total) by mouth 4 (four) times daily. 08/29/16 09/08/16  Rodell Perna A, PA-C  HYDROcodone-acetaminophen (NORCO/VICODIN) 5-325 MG tablet Take 1-2 tablets by mouth every 4 (four) hours as needed for moderate pain or severe pain. 02/04/15   Jackolyn Confer, MD    Family History History reviewed. No pertinent family history.  Social History Social History  Substance Use Topics  . Smoking status: Current Every Day Smoker    Packs/day: 0.25    Years: 19.00    Types: Cigarettes  . Smokeless tobacco: Current User    Types: Snuff     Comment: smokers 1ppwk trying to quit  . Alcohol use No     Allergies   Penicillins   Review of Systems Review of Systems  Constitutional: Negative for chills and fever.  Musculoskeletal: Negative for arthralgias.  Skin: Positive for rash.  All other systems reviewed and are negative.   Physical Exam Updated Vital Signs BP 114/83 (BP Location: Right Arm)   Pulse (!) 53   Temp 98.3 F (36.8 C)   Resp 18   Wt 77.1 kg (170 lb)   SpO2 99%   BMI 22.43 kg/m   Physical Exam  Constitutional: He appears well-developed and well-nourished.  HENT:  Head: Normocephalic.  Eyes: Conjunctivae are normal. Right eye exhibits no  discharge. Left eye exhibits no discharge.  Neck: Normal range of motion. No JVD present. No tracheal deviation present.  Cardiovascular: Normal rate.   Pulmonary/Chest: Effort normal.  Abdominal: He exhibits no distension.  Genitourinary:  Genitourinary Comments: Chaperone present.   Musculoskeletal: Normal range of motion.  Neurological: He is alert.  Skin: Skin is warm and dry. There is erythema.  Right medial thigh with pinpoint raised lesion and 2 cm area of surrounding erythema, somewhat indurated. There is further surrounding erythema that is lighter  in color extending out approximately 6 cm from the lesion. Mildly tender to palpation. No fluctuance, drainage, bleeding, excoriations, desquamation, vesicles, pustules.  Psychiatric: He has a normal mood and affect. His behavior is normal.  Nursing note and vitals reviewed.    ED Treatments / Results  DIAGNOSTIC STUDIES: Oxygen Saturation is 99% on RA, normal by my interpretation.   COORDINATION OF CARE: 9:00 PM-Discussed next steps with pt which includes taking antibiotics, ibuprofen or tylenol for pain. Use heat or ice as needed. If sx worsen or change pt is to return for reevaluation. Pt verbalized understanding and is agreeable with the plan.   Labs (all labs ordered are listed, but only abnormal results are displayed) Labs Reviewed - No data to display  EKG  EKG Interpretation None       Radiology No results found.  Procedures Procedures (including critical care time)  Medications Ordered in ED Medications  clindamycin (CLEOCIN) capsule 300 mg (not administered)     Initial Impression / Assessment and Plan / ED Course  I have reviewed the triage vital signs and the nursing notes.  Pertinent labs & imaging results that were available during my care of the patient were reviewed by me and considered in my medical decision making (see chart for details).     Patient presents with cellulitis secondary to possible insect bite or folliculitis. No systemic signs of infection. Pt is without risk factors for HIV; no recent use of steroids or other immunosuppressive medications; no Hx of diabetes.  Pt is without gross abscess for which I&D would be possible.  Area marked and pt encouraged to return if redness begins to streak, extends beyond the markings, and/or fever or nausea/vomiting develop.  Pt is alert, oriented, NAD, afebrile, non tachycardic, nonseptic and nontoxic appearing.  Pt to be d/c on oral antibiotics (clindamycin due to PCN allergy) with strict Indications for  return to the ED. Pt verbalized understanding of and agreement with plan and is safe for discharge home at this time.   Final Clinical Impressions(s) / ED Diagnoses   Final diagnoses:  Cellulitis of right lower extremity    New Prescriptions New Prescriptions   CLINDAMYCIN (CLEOCIN) 150 MG CAPSULE    Take 2 capsules (300 mg total) by mouth 4 (four) times daily.  I personally performed the services described in this documentation, which was scribed in my presence. The recorded information has been reviewed and is accurate.    Debroah Baller 08/29/16 2118    Daleen Bo, MD 08/31/16 1320

## 2016-08-29 NOTE — ED Triage Notes (Addendum)
Patient presents with erythema to right upper thigh. Patient reports being at work yesterday, when he noted a "sharp pain then really bad itching" to his right thigh. Patient noted the reddened area "seemed to spread overnight." Patient reported he put hydrocortisone cream on the site and felt relief from the itching, but not the pain. Upon inspection, this writer noted increased erythema to right upper inner thigh, with spreading redness up patient's groin and inside his thigh. Patient unsure if the redness has spread to his scrotum. Patient denies fevers at this time.

## 2016-08-29 NOTE — ED Notes (Signed)
Skin marker used to mark outer edges of patient erythema on right thigh. Patient educated to watch spread of erythema and to use marker as needed to measure growth. Patient verbalizes understanding.

## 2017-03-02 ENCOUNTER — Encounter (HOSPITAL_COMMUNITY): Payer: Self-pay

## 2017-03-02 ENCOUNTER — Emergency Department (HOSPITAL_COMMUNITY): Payer: BLUE CROSS/BLUE SHIELD

## 2017-03-02 ENCOUNTER — Other Ambulatory Visit: Payer: Self-pay

## 2017-03-02 ENCOUNTER — Emergency Department (HOSPITAL_COMMUNITY)
Admission: EM | Admit: 2017-03-02 | Discharge: 2017-03-02 | Disposition: A | Discharge status: Home / Self Care | Payer: BLUE CROSS/BLUE SHIELD | Attending: Emergency Medicine | Admitting: Emergency Medicine

## 2017-03-02 DIAGNOSIS — R109 Unspecified abdominal pain: Secondary | ICD-10-CM

## 2017-03-02 DIAGNOSIS — R1012 Left upper quadrant pain: Secondary | ICD-10-CM | POA: Insufficient documentation

## 2017-03-02 DIAGNOSIS — F17210 Nicotine dependence, cigarettes, uncomplicated: Secondary | ICD-10-CM | POA: Diagnosis not present

## 2017-03-02 LAB — COMPREHENSIVE METABOLIC PANEL
ALT: 16 U/L — ABNORMAL LOW (ref 17–63)
AST: 21 U/L (ref 15–41)
Albumin: 4.6 g/dL (ref 3.5–5.0)
Alkaline Phosphatase: 51 U/L (ref 38–126)
Anion gap: 9 (ref 5–15)
BUN: 8 mg/dL (ref 6–20)
CO2: 25 mmol/L (ref 22–32)
Calcium: 9 mg/dL (ref 8.9–10.3)
Chloride: 105 mmol/L (ref 101–111)
Creatinine, Ser: 0.87 mg/dL (ref 0.61–1.24)
GFR calc Af Amer: >60 mL/min (ref >60)
GFR calc non Af Amer: >60 mL/min (ref >60)
Glucose, Bld: 82 mg/dL (ref 65–99)
Potassium: 3.4 mmol/L — ABNORMAL LOW (ref 3.5–5.1)
Sodium: 139 mmol/L (ref 135–145)
Total Bilirubin: 1.2 mg/dL (ref 0.3–1.2)
Total Protein: 7.5 g/dL (ref 6.5–8.1)

## 2017-03-02 LAB — CBC WITH DIFFERENTIAL/PLATELET
Basophils Absolute: 0.2 10*3/uL — ABNORMAL HIGH (ref 0.0–0.1)
Basophils Relative: 2 %
Eosinophils Absolute: 0.2 10*3/uL (ref 0.0–0.7)
Eosinophils Relative: 2 %
HCT: 38.8 % — ABNORMAL LOW (ref 39.0–52.0)
Hemoglobin: 13.3 g/dL (ref 13.0–17.0)
Lymphocytes Relative: 41 %
Lymphs Abs: 4.6 10*3/uL — ABNORMAL HIGH (ref 0.7–4.0)
MCH: 29.7 pg (ref 26.0–34.0)
MCHC: 34.3 g/dL (ref 30.0–36.0)
MCV: 86.6 fL (ref 78.0–100.0)
Monocytes Absolute: 0.7 10*3/uL (ref 0.1–1.0)
Monocytes Relative: 6 %
Neutro Abs: 5.5 10*3/uL (ref 1.7–7.7)
Neutrophils Relative %: 49 %
Platelets: 286 10*3/uL (ref 150–400)
RBC: 4.48 MIL/uL (ref 4.22–5.81)
RDW: 13 % (ref 11.5–15.5)
WBC: 11.2 10*3/uL — ABNORMAL HIGH (ref 4.0–10.5)

## 2017-03-02 LAB — URINALYSIS, ROUTINE W REFLEX MICROSCOPIC
Bilirubin Urine: NEGATIVE
Glucose, UA: NEGATIVE mg/dL
HGB URINE DIPSTICK: NEGATIVE
KETONES UR: NEGATIVE mg/dL
LEUKOCYTES UA: NEGATIVE
Nitrite: NEGATIVE
PROTEIN: NEGATIVE mg/dL
Specific Gravity, Urine: 1.004 — ABNORMAL LOW (ref 1.005–1.030)
pH: 8 (ref 5.0–8.0)

## 2017-03-02 LAB — LIPASE, BLOOD: Lipase: 34 U/L (ref 11–51)

## 2017-03-02 MED ORDER — TAMSULOSIN HCL 0.4 MG PO CAPS: 0.4000 mg | ORAL_CAPSULE | Freq: Every day | ORAL | 0 refills | Status: DC

## 2017-03-02 MED ORDER — ONDANSETRON HCL 4 MG PO TABS: 4.0000 mg | ORAL_TABLET | Freq: Four times a day (QID) | ORAL | 0 refills | Status: DC

## 2017-03-02 MED ORDER — IBUPROFEN 800 MG PO TABS: 800.0000 mg | ORAL_TABLET | Freq: Three times a day (TID) | ORAL | 0 refills | Status: DC

## 2017-03-02 MED ORDER — SODIUM CHLORIDE 0.9 % IV BOLUS (SEPSIS)
1000.0000 mL | Freq: Once | INTRAVENOUS | Status: AC
  Administered 2017-03-02: 1000 mL via INTRAVENOUS

## 2017-03-02 MED ORDER — MORPHINE SULFATE (PF) 4 MG/ML IV SOLN
4.0000 mg | Freq: Once | INTRAVENOUS | Status: AC
  Administered 2017-03-02: 4 mg via INTRAVENOUS
  Filled 2017-03-02: qty 1

## 2017-03-02 MED ORDER — HYDROCODONE-ACETAMINOPHEN 5-325 MG PO TABS: 1.0000 | ORAL_TABLET | Freq: Four times a day (QID) | ORAL | 0 refills | Status: DC | PRN

## 2017-03-02 MED ORDER — KETOROLAC TROMETHAMINE 30 MG/ML IJ SOLN
30.0000 mg | Freq: Once | INTRAMUSCULAR | Status: AC
  Administered 2017-03-02: 30 mg via INTRAVENOUS
  Filled 2017-03-02: qty 1

## 2017-03-02 NOTE — ED Provider Notes (Signed)
[facility] DEPT Provider Note   CSN: [id number] Arrival date & time: 03/02/17  1101     History   Chief Complaint Chief Complaint  Patient presents with  . Flank Pain    HPI Julian Clark is a 36 y.o. male with history of Wolff-Parkinson-White syndrome, photodermatitis who presents with a 2-day history of left flank pain, left lower quadrant pain, frequency and urgency with urination, and bilateral testicular pressure.  Patient reports he may have had a kidney stone when he was around 33, however he does not really remember it.  He denies any testicular swelling or penile discharge.  He has had some associated nausea, but no vomiting.  He has also noticed relatively decreased urinary output.  He denies any fevers, chest pain, shortness of breath.  He has not taken any medication at home for symptoms.  HPI  Past Medical History:  Diagnosis Date  . 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 Soft tissue mass    ENLARGED LEFT UPPER BACK-- SOFTBALL SIZE  . Wears contact lenses   . WPW (Wolff-Parkinson-White syndrome) no old ekg available with [facility] called ekg department 02-02-2015   dx age 31--  states no issues or symptoms since teen yrs and was told would grow out of it    There are no active problems to display for this patient.   Past Surgical History:  Procedure Laterality Date  . MASS EXCISION N/A 02/04/2015   Procedure: REMOVAL SOFT TISSUE  MASSES ON BACK X 2;  Surgeon: Jackolyn Confer, MD;  Location: [facility];  Service: General;  Laterality: N/A;  . NO PAST SURGERIES         Home Medications    Prior to Admission medications   Medication Sig Start Date End Date Taking? Authorizing Provider  HYDROcodone-acetaminophen (NORCO/VICODIN) 5-325 MG tablet Take 1-2 tablets by mouth every 6 (six) hours as needed. 03/02/17   Tudor Chandley, Bea Graff, PA-C  ibuprofen (ADVIL,MOTRIN) 800 MG tablet Take 1 tablet (800 mg  total) by mouth 3 (three) times daily. 03/02/17   Yosiah Jasmin, Bea Graff, PA-C  ondansetron (ZOFRAN) 4 MG tablet Take 1 tablet (4 mg total) by mouth every 6 (six) hours. 03/02/17   Frederica Kuster, PA-C  tamsulosin (FLOMAX) 0.4 MG CAPS capsule Take 1 capsule (0.4 mg total) by mouth daily after supper. 03/02/17   Frederica Kuster, PA-C    Family History History reviewed. No pertinent family history.  Social History Social History   Tobacco Use  . Smoking status: Current Every Day Smoker    Packs/day: 0.25    Years: 19.00    Pack years: 4.75    Types: Cigarettes  . Smokeless tobacco: Current User    Types: Snuff  . Tobacco comment: smokers 1ppwk trying to quit  Substance Use Topics  . Alcohol use: Yes    Comment: rarely  . Drug use: Yes    Frequency: 2.0 times per week    Types: Marijuana     Allergies   Penicillins   Review of Systems Review of Systems  Constitutional: Negative for chills and fever.  HENT: Negative for facial swelling and sore throat.   Respiratory: Negative for shortness of breath.   Cardiovascular: Negative for chest pain.  Gastrointestinal: Positive for abdominal pain (LLQ) and nausea. Negative for vomiting.  Genitourinary: Positive for decreased urine volume, frequency, testicular pain and urgency. Negative for discharge, dysuria, penile pain, penile swelling and scrotal swelling.  Musculoskeletal: Negative for back pain.  Skin: Negative for rash and wound.  Neurological: Negative for headaches.  Psychiatric/Behavioral: The patient is not nervous/anxious.      Physical Exam Updated Vital Signs BP (!) 135/91 (BP Location: Left Arm)   Pulse (!) 50   Temp 98.4 F (36.9 C) (Oral)   Resp 18   Ht 6' (1.829 m)   Wt 68 kg (150 lb)   SpO2 100%   BMI 20.34 kg/m   Physical Exam  Constitutional: He appears well-developed and well-nourished. No distress.  HENT:  Head: Normocephalic and atraumatic.  Mouth/Throat: Oropharynx is clear and moist. No  oropharyngeal exudate.  Eyes: Conjunctivae are normal. Pupils are equal, round, and reactive to light. Right eye exhibits no discharge. Left eye exhibits no discharge. No scleral icterus.  Neck: Normal range of motion. Neck supple. No thyromegaly present.  Cardiovascular: Regular rhythm, normal heart sounds and intact distal pulses. Exam reveals no gallop and no friction rub.  No murmur heard. Pulmonary/Chest: Effort normal and breath sounds normal. No stridor. No respiratory distress. He has no wheezes. He has no rales.  Abdominal: Soft. Bowel sounds are normal. He exhibits no distension. There is tenderness in the left lower quadrant. There is CVA tenderness (L). There is no rebound and no guarding. Hernia confirmed negative in the right inguinal area and confirmed negative in the left inguinal area.    Genitourinary: Penis normal. Right testis shows tenderness. Right testis shows no mass and no swelling. Left testis shows tenderness. Left testis shows no mass and no swelling. No discharge found.  Genitourinary Comments: No peroneal tenderness  Musculoskeletal: He exhibits no edema.  Lymphadenopathy:    He has no cervical adenopathy.  Neurological: He is alert. Coordination normal.  Skin: Skin is warm and dry. No rash noted. He is not diaphoretic. No pallor.  Psychiatric: He has a normal mood and affect.  Nursing note and vitals reviewed.    ED Treatments / Results  Labs (all labs ordered are listed, but only abnormal results are displayed) Labs Reviewed  URINALYSIS, ROUTINE W REFLEX MICROSCOPIC - Abnormal; Notable for the following components:      Result Value   Color, Urine STRAW (*)    Specific Gravity, Urine 1.004 (*)    All other components within normal limits  COMPREHENSIVE METABOLIC PANEL - Abnormal; Notable for the following components:   Potassium 3.4 (*)    ALT 16 (*)    All other components within normal limits  CBC WITH DIFFERENTIAL/PLATELET - Abnormal; Notable for  the following components:   WBC 11.2 (*)    HCT 38.8 (*)    Lymphs Abs 4.6 (*)    Basophils Absolute 0.2 (*)    All other components within normal limits  URINE CULTURE  LIPASE, BLOOD  RPR  HIV ANTIBODY (ROUTINE TESTING)  GC/CHLAMYDIA PROBE AMP ([place]) NOT AT [facility]    EKG  EKG Interpretation None       Radiology Ct Renal Stone Study  Result Date: 03/02/2017 CLINICAL DATA:  Left flank pain radiating to the left lower quadrant x1 day with decreased urinary output. Discolored urine. EXAM: CT ABDOMEN AND PELVIS WITHOUT CONTRAST TECHNIQUE: Multidetector CT imaging of the abdomen and pelvis was performed following the standard protocol without IV contrast. COMPARISON:  None. FINDINGS: Lower chest: No acute abnormality. Dependent atelectasis. Normal size heart. Hepatobiliary: No focal liver abnormality is seen. No gallstones, gallbladder wall thickening, or biliary dilatation. Pancreas: Unremarkable. No pancreatic ductal dilatation or surrounding  inflammatory changes. Spleen: Normal in size without focal abnormality. Adrenals/Urinary Tract: Normal bilateral adrenal glands. No hydroureteronephrosis nor renal calculi. Circumferential thickening of the bladder despite underdistention. Findings are concerning for changes of a cystitis. No intraluminal mass or calculus. A punctate calcification is seen the left hemipelvis on series 2, image 72 but given lack of hydroureteronephrosis, a ureteral stone is believed less likely. There is more likely to represent a small phlebolith. Stomach/Bowel: Stomach is within normal limits. Appendix appears normal. No evidence of bowel wall thickening, distention, or inflammatory changes. Vascular/Lymphatic: No significant vascular findings are present. No enlarged abdominal or pelvic lymph nodes. Reproductive: Prostate is unremarkable. Other: No abdominal wall hernia or abnormality. No abdominopelvic ascites. Musculoskeletal: No acute or significant osseous  findings. IMPRESSION: Circumferentially thickened urinary bladder despite underdistention, query cystitis. No obstructive uropathy or nephrolithiasis. Electronically Signed   By: Ashley Royalty M.D.   On: 03/02/2017 20:44    Procedures Procedures (including critical care time)  Medications Ordered in ED Medications  sodium chloride 0.9 % bolus 1,000 mL (0 mLs Intravenous Stopped 03/02/17 2101)  ketorolac (TORADOL) 30 MG/ML injection 30 mg (30 mg Intravenous Given 03/02/17 2002)  morphine 4 MG/ML injection 4 mg (4 mg Intravenous Given 03/02/17 2200)     Initial Impression / Assessment and Plan / ED Course  I have reviewed the triage vital signs and the nursing notes.  Pertinent labs & imaging results that were available during my care of the patient were reviewed by me and considered in my medical decision making (see chart for details).     Patient with suspected kidney stone.  CBC shows a mild elevation in white blood cell count, 11.2.  CMP shows mild hypokalemia, 3.4, kidney function within normal limits.  UA is negative.  CT renal stone study shows circumferentially thickened urinary bladder, punctate calcification in the left hemipelvis which could be small phlebolith or ureteral stone.  No hydroureteronephrosis.  Considering patient's symptoms very convincing of stone disease, will treat as such.  Patient given pain control, Zofran, Flomax, urine strainer.  Patient requesting STD testing, GC/chlamydia urine, HIV and RPR sent and pending.  Patient has low suspicion of exposure at this time.  Patient reports his pain starts in, and is worse in his kidney and has been radiating to his testicles.  Do not feel scrotal ultrasound is indicated at this time.  Patient advised to follow-up with urology within 1 week.  Return precautions discussed.  Patient understands and agrees with plan.  Patient  vitals stable and discharged in satisfactory condition. I discussed patient case with Dr. Dayna Barker who  guided the patient's management and agrees with plan.   Final Clinical Impressions(s) / ED Diagnoses   Final diagnoses:  Left flank pain    ED Discharge Orders        Ordered    ibuprofen (ADVIL,MOTRIN) 800 MG tablet  3 times daily     03/02/17 2245    HYDROcodone-acetaminophen (NORCO/VICODIN) 5-325 MG tablet  Every 6 hours PRN     03/02/17 2245    ondansetron (ZOFRAN) 4 MG tablet  Every 6 hours     03/02/17 2245    tamsulosin (FLOMAX) 0.4 MG CAPS capsule  Daily after supper     03/02/17 [address] PA-C 03/02/17 2308    Merrily Pew, MD 03/05/17 [phone]

## 2017-03-02 NOTE — ED Triage Notes (Signed)
Pt c/o L flank pain radiating to LLQ x 1 day and sts decreased urinary output.  Pain score 4/10.  Pt has not taken anything for pain.  No Hx of kidney stones.  Sts urine is discolored.

## 2017-03-02 NOTE — Discharge Instructions (Signed)
Medications: Norco, ibuprofen, Zofran, Flomax  Treatment: Take 1-2 Norco every 4-6 hours as needed for severe pain.  You can alternate with ibuprofen every 8 hours as prescribed.  Take Zofran every 6 hours as needed for nausea or vomiting.  Take Flomax once daily at bedtime.  This medication might help you pass the suspected stone.  Strain your urine to help me know if you may have passed a stone.  This bring the stone to your urology appointment if you are able.  They may want to test its composition.  You will be called in 3 business days if you test positive for any of the STD testing we have completed today.  If positive, please follow-up with your doctor or health department for treatment.  Please also tell all of your sexual partners that they will need to be treated as well.  Follow-up: Please follow-up with urologist within 1 week for further evaluation and treatment.  Please return to the emergency department if you develop any new or worsening symptoms.

## 2017-03-03 LAB — RPR: RPR Ser Ql: NONREACTIVE

## 2017-03-03 LAB — HIV ANTIBODY (ROUTINE TESTING W REFLEX): HIV Screen 4th Generation wRfx: NONREACTIVE

## 2017-03-04 LAB — URINE CULTURE
Culture: NO GROWTH
Special Requests: NORMAL

## 2017-03-05 LAB — GC/CHLAMYDIA PROBE AMP (~~LOC~~) NOT AT ARMC
Chlamydia: NEGATIVE
Neisseria Gonorrhea: NEGATIVE

## 2017-05-25 ENCOUNTER — Encounter (HOSPITAL_COMMUNITY): Payer: Self-pay | Admitting: Emergency Medicine

## 2017-05-25 ENCOUNTER — Ambulatory Visit (INDEPENDENT_AMBULATORY_CARE_PROVIDER_SITE_OTHER): Payer: BLUE CROSS/BLUE SHIELD

## 2017-05-25 ENCOUNTER — Ambulatory Visit (HOSPITAL_COMMUNITY)
Admission: EM | Admit: 2017-05-25 | Discharge: 2017-05-25 | Disposition: A | Discharge status: Home / Self Care | Payer: BLUE CROSS/BLUE SHIELD | Attending: Internal Medicine | Admitting: Internal Medicine

## 2017-05-25 DIAGNOSIS — R05 Cough: Secondary | ICD-10-CM

## 2017-05-25 DIAGNOSIS — J40 Bronchitis, not specified as acute or chronic: Secondary | ICD-10-CM

## 2017-05-25 DIAGNOSIS — R0789 Other chest pain: Secondary | ICD-10-CM

## 2017-05-25 DIAGNOSIS — R0602 Shortness of breath: Secondary | ICD-10-CM | POA: Diagnosis not present

## 2017-05-25 MED ORDER — IPRATROPIUM-ALBUTEROL 0.5-2.5 (3) MG/3ML IN SOLN
3.0000 mL | Freq: Once | RESPIRATORY_TRACT | Status: AC
  Administered 2017-05-25: 3 mL via RESPIRATORY_TRACT

## 2017-05-25 MED ORDER — MELOXICAM 7.5 MG PO TABS: 7.5000 mg | ORAL_TABLET | Freq: Every day | ORAL | 0 refills | Status: DC

## 2017-05-25 MED ORDER — ALBUTEROL SULFATE HFA 108 (90 BASE) MCG/ACT IN AERS: 1.0000 | INHALATION_SPRAY | Freq: Four times a day (QID) | RESPIRATORY_TRACT | 0 refills | Status: DC | PRN

## 2017-05-25 MED ORDER — PREDNISONE 20 MG PO TABS: 40.0000 mg | ORAL_TABLET | Freq: Every day | ORAL | 0 refills | Status: AC

## 2017-05-25 MED ORDER — HYDROCOD POLST-CPM POLST ER 10-8 MG/5ML PO SUER: 5.0000 mL | Freq: Every evening | ORAL | 0 refills | Status: DC | PRN

## 2017-05-25 MED ORDER — BENZONATATE 100 MG PO CAPS: 100.0000 mg | ORAL_CAPSULE | Freq: Three times a day (TID) | ORAL | 0 refills | Status: DC

## 2017-05-25 MED ORDER — IPRATROPIUM-ALBUTEROL 0.5-2.5 (3) MG/3ML IN SOLN
RESPIRATORY_TRACT | Status: AC
  Filled 2017-05-25: qty 3

## 2017-05-25 MED ORDER — CLINDAMYCIN HCL 300 MG PO CAPS: 300.0000 mg | ORAL_CAPSULE | Freq: Two times a day (BID) | ORAL | 0 refills | Status: AC

## 2017-05-25 NOTE — Discharge Instructions (Signed)
Chest x-ray negative for fracture or pneumonia.  Start clindamycin as directed.  Prednisone as directed.  Tessalon for cough.  Tussionex at night for cough.  Mobic for pain.  Albuterol as needed for shortness of breath or wheezing.  Follow-up with your PCP for reevaluation needed. Monitor for any worsening of symptoms, chest pain, shortness of breath, wheezing, swelling of the throat, follow up for reevaluation.

## 2017-05-25 NOTE — ED Provider Notes (Signed)
[facility]    CSN: [id number] Arrival date & time: 05/25/17  1732     History   Chief Complaint Chief Complaint  Patient presents with  . Cough    HPI Julian Clark is a 37 y.o. male.   37 year old male with history of WPW comes in for 2-week history of URI symptoms.  He has had productive cough without obvious rhinorrhea, nasal congestion.  Denies fever, chills, night sweats.  Patient states prior to current episode, he had a 10-day history of cough and was put on doxycycline and prednisone with improvement but without complete resolution.  It has been about a week since current symptom onset.  States has chest pain when coughing, but also tenderness to the right ribs, worried about rib fracture.  He feels short of breath, where he cannot take a deep breath.  Denies any inhaler use.  Is not taking anything else for the symptoms.  Is a current every day smoker, 20-pack-year history.      Past Medical History:  Diagnosis Date  . 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 Soft tissue mass    ENLARGED LEFT UPPER BACK-- SOFTBALL SIZE  . Wears contact lenses   . WPW (Wolff-Parkinson-White syndrome) no old ekg available with [place] called ekg department 02-02-2015   dx age 49--  states no issues or symptoms since teen yrs and was told would grow out of it    There are no active problems to display for this patient.   Past Surgical History:  Procedure Laterality Date  . MASS EXCISION N/A 02/04/2015   Procedure: REMOVAL SOFT TISSUE  MASSES ON BACK X 2;  Surgeon: Jackolyn Confer, MD;  Location: [facility];  Service: General;  Laterality: N/A;  . NO PAST SURGERIES         Home Medications    Prior to Admission medications   Medication Sig Start Date End Date Taking? Authorizing Provider  albuterol (PROVENTIL HFA;VENTOLIN HFA) 108 (90 Base) MCG/ACT inhaler Inhale 1-2 puffs into the lungs every 6 (six) hours as needed for wheezing or  shortness of breath. 05/25/17   Tasia Catchings, Arslan Kier V, PA-C  benzonatate (TESSALON) 100 MG capsule Take 1 capsule (100 mg total) by mouth every 8 (eight) hours. 05/25/17   Tasia Catchings, Mckinley Olheiser V, PA-C  chlorpheniramine-HYDROcodone (TUSSIONEX PENNKINETIC ER) 10-8 MG/5ML SUER Take 5 mLs by mouth at bedtime as needed for cough. 05/25/17   Tasia Catchings, Zadin Lange V, PA-C  clindamycin (CLEOCIN) 300 MG capsule Take 1 capsule (300 mg total) by mouth 2 (two) times daily for 7 days. 05/25/17 06/01/17  Ok Edwards, PA-C  HYDROcodone-acetaminophen (NORCO/VICODIN) 5-325 MG tablet Take 1-2 tablets by mouth every 6 (six) hours as needed. 03/02/17   Law, Bea Graff, PA-C  ibuprofen (ADVIL,MOTRIN) 800 MG tablet Take 1 tablet (800 mg total) by mouth 3 (three) times daily. 03/02/17   Law, Bea Graff, PA-C  meloxicam (MOBIC) 7.5 MG tablet Take 1 tablet (7.5 mg total) by mouth daily. 05/25/17   Tasia Catchings, Taylia Berber V, PA-C  ondansetron (ZOFRAN) 4 MG tablet Take 1 tablet (4 mg total) by mouth every 6 (six) hours. 03/02/17   Law, Bea Graff, PA-C  predniSONE (DELTASONE) 20 MG tablet Take 2 tablets (40 mg total) by mouth daily for 5 days. 05/25/17 05/30/17  Ok Edwards, PA-C  tamsulosin (FLOMAX) 0.4 MG CAPS capsule Take 1 capsule (0.4 mg total) by mouth daily after supper. 03/02/17   Frederica Kuster,  PA-C    Family History History reviewed. No pertinent family history.  Social History Social History   Tobacco Use  . Smoking status: Current Every Day Smoker    Packs/day: 0.25    Years: 19.00    Pack years: 4.75    Types: Cigarettes  . Smokeless tobacco: Current User    Types: Snuff  . Tobacco comment: smokers 1ppwk trying to quit  Substance Use Topics  . Alcohol use: Yes    Comment: rarely  . Drug use: Yes    Frequency: 2.0 times per week    Types: Marijuana     Allergies   Penicillins   Review of Systems Review of Systems  Reason unable to perform ROS: See HPI as above.     Physical Exam Triage Vital Signs ED Triage Vitals [05/25/17 1807]  Enc Vitals Group       BP 118/75     Pulse Rate (!) 50     Resp 18     Temp 98.1 F (36.7 C)     Temp Source Oral     SpO2 97 %     Weight      Height      Head Circumference      Peak Flow      Pain Score      Pain Loc      Pain Edu?      Excl. in [place]?    No data found.  Updated Vital Signs BP 118/75 (BP Location: Left Arm)   Pulse (!) 50   Temp 98.1 F (36.7 C) (Oral)   Resp 18   SpO2 97%   Physical Exam  Constitutional: He is oriented to person, place, and time. He appears well-developed and well-nourished. No distress.  HENT:  Head: Normocephalic and atraumatic.  Right Ear: External ear and ear canal normal. Tympanic membrane is not erythematous and not bulging.  Left Ear: External ear and ear canal normal. Tympanic membrane is not erythematous and not bulging.  Nose: Rhinorrhea present. No mucosal edema. Right sinus exhibits no maxillary sinus tenderness and no frontal sinus tenderness. Left sinus exhibits no maxillary sinus tenderness and no frontal sinus tenderness.  Mouth/Throat: Uvula is midline, oropharynx is clear and moist and mucous membranes are normal. No tonsillar exudate.  Bilateral opaque TM.  Eyes: Conjunctivae are normal. Pupils are equal, round, and reactive to light.  Neck: Normal range of motion. Neck supple.  Cardiovascular: Normal rate, regular rhythm and normal heart sounds. Exam reveals no gallop and no friction rub.  No murmur heard. Pulmonary/Chest: Effort normal and breath sounds normal. No stridor. He has no decreased breath sounds. He has no wheezes. He has no rhonchi. He has no rales.  Right chest wall tenderness, especially adjacent to the sternum.  Lymphadenopathy:    He has no cervical adenopathy.  Neurological: He is alert and oriented to person, place, and time.  Skin: Skin is warm and dry.  Psychiatric: He has a normal mood and affect. His behavior is normal. Judgment normal.     UC Treatments / Results  Labs (all labs ordered are listed, but  only abnormal results are displayed) Labs Reviewed - No data to display  EKG  EKG Interpretation None       Radiology Dg Chest 2 View  Result Date: 05/25/2017 CLINICAL DATA:  Cough short of breath EXAM: CHEST - 2 VIEW COMPARISON:  None. FINDINGS: The heart size and mediastinal contours are within normal limits. Both lungs are  clear. Linear opacity projecting over the left axillary region. IMPRESSION: No active cardiopulmonary disease. Electronically Signed   By: Donavan Foil M.D.   On: 05/25/2017 18:43    Procedures Procedures (including critical care time)  Medications Ordered in UC Medications  ipratropium-albuterol (DUONEB) 0.5-2.5 (3) MG/3ML nebulizer solution 3 mL (3 mLs Nebulization Given 05/25/17 1923)     Initial Impression / Assessment and Plan / UC Course  I have reviewed the triage vital signs and the nursing notes.  Pertinent labs & imaging results that were available during my care of the patient were reviewed by me and considered in my medical decision making (see chart for details).    Patient with improved symptoms after DuoNeb treatment.  EKG with bradycardia, 42 bpm, T wave changes in V1, no other acute abnormalities.  No delta wave.  Chest x-ray negative for active cardiopulmonary disease.  No obvious rib fractures.  Given smoking history, will treat for bronchitis with clindamycin and prednisone.  Albuterol as needed for shortness of breath and wheezing.  Other symptomatic treatment provided.  Smoking cessation discussed.  Return precautions given.  Patient expresses understanding and agrees to plan.  Case discussed with Dr. Valere Dross, who agrees to plan.  Final Clinical Impressions(s) / UC Diagnoses   Final diagnoses:  Bronchitis    ED Discharge Orders        Ordered    clindamycin (CLEOCIN) 300 MG capsule  2 times daily     05/25/17 1950    albuterol (PROVENTIL HFA;VENTOLIN HFA) 108 (90 Base) MCG/ACT inhaler  Every 6 hours PRN     05/25/17 1950     benzonatate (TESSALON) 100 MG capsule  Every 8 hours     05/25/17 1950    predniSONE (DELTASONE) 20 MG tablet  Daily     05/25/17 1950    meloxicam (MOBIC) 7.5 MG tablet  Daily     05/25/17 1950    chlorpheniramine-HYDROcodone (TUSSIONEX PENNKINETIC ER) 10-8 MG/5ML SUER  At bedtime PRN     05/25/17 1950       Controlled Substance Prescriptions [place] Controlled Substance Registry consulted? Yes, I have consulted the [place] Controlled Substances Registry for this patient, and feel the risk/benefit ratio today is favorable for proceeding with this prescription for a controlled substance.   Ok Edwards, PA-C 05/25/17 2047

## 2017-05-25 NOTE — ED Triage Notes (Signed)
Pt sts cough x 2 weeks

## 2018-01-19 ENCOUNTER — Emergency Department (HOSPITAL_COMMUNITY)
Admission: EM | Admit: 2018-01-19 | Discharge: 2018-01-19 | Disposition: A | Discharge status: Home / Self Care | Payer: BLUE CROSS/BLUE SHIELD | Attending: Emergency Medicine | Admitting: Emergency Medicine

## 2018-01-19 ENCOUNTER — Other Ambulatory Visit: Payer: Self-pay

## 2018-01-19 ENCOUNTER — Encounter (HOSPITAL_COMMUNITY): Payer: Self-pay | Admitting: Emergency Medicine

## 2018-01-19 DIAGNOSIS — K029 Dental caries, unspecified: Secondary | ICD-10-CM | POA: Insufficient documentation

## 2018-01-19 DIAGNOSIS — Z79899 Other long term (current) drug therapy: Secondary | ICD-10-CM | POA: Diagnosis not present

## 2018-01-19 DIAGNOSIS — K047 Periapical abscess without sinus: Secondary | ICD-10-CM | POA: Diagnosis not present

## 2018-01-19 DIAGNOSIS — F17210 Nicotine dependence, cigarettes, uncomplicated: Secondary | ICD-10-CM | POA: Insufficient documentation

## 2018-01-19 DIAGNOSIS — K0889 Other specified disorders of teeth and supporting structures: Secondary | ICD-10-CM | POA: Diagnosis present

## 2018-01-19 MED ORDER — CLINDAMYCIN HCL 150 MG PO CAPS: 450.0000 mg | ORAL_CAPSULE | Freq: Four times a day (QID) | ORAL | 0 refills | Status: AC

## 2018-01-19 MED ORDER — NAPROXEN 375 MG PO TABS: 375.0000 mg | ORAL_TABLET | Freq: Two times a day (BID) | ORAL | 0 refills | Status: DC

## 2018-01-19 NOTE — ED Triage Notes (Signed)
Pt with R lower dental pain x 2 days. States he woke this morning with swelling.

## 2018-01-19 NOTE — ED Provider Notes (Signed)
[facility] DEPT Provider Note   CSN: [id number] Arrival date & time: 01/19/18  2703     History   Chief Complaint Chief Complaint  Patient presents with  . Dental Pain    HPI Julian Clark is a 37 y.o. male.  HPI    Past Medical History:  Diagnosis Date  . 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 Soft tissue mass    ENLARGED LEFT UPPER BACK-- SOFTBALL SIZE  . Wears contact lenses   . WPW (Wolff-Parkinson-White syndrome) no old ekg available with [facility] called ekg department 02-02-2015   dx age 34--  states no issues or symptoms since teen yrs and was told would grow out of it    There are no active problems to display for this patient.   Past Surgical History:  Procedure Laterality Date  . MASS EXCISION N/A 02/04/2015   Procedure: REMOVAL SOFT TISSUE  MASSES ON BACK X 2;  Surgeon: Jackolyn Confer, MD;  Location: [facility];  Service: General;  Laterality: N/A;  . NO PAST SURGERIES          Home Medications    Prior to Admission medications   Medication Sig Start Date End Date Taking? Authorizing Provider  albuterol (PROVENTIL HFA;VENTOLIN HFA) 108 (90 Base) MCG/ACT inhaler Inhale 1-2 puffs into the lungs every 6 (six) hours as needed for wheezing or shortness of breath. 05/25/17   Tasia Catchings, Amy V, PA-C  benzonatate (TESSALON) 100 MG capsule Take 1 capsule (100 mg total) by mouth every 8 (eight) hours. 05/25/17   Tasia Catchings, Amy V, PA-C  chlorpheniramine-HYDROcodone (TUSSIONEX PENNKINETIC ER) 10-8 MG/5ML SUER Take 5 mLs by mouth at bedtime as needed for cough. 05/25/17   Tasia Catchings, Amy V, PA-C  clindamycin (CLEOCIN) 150 MG capsule Take 3 capsules (450 mg total) by mouth 4 (four) times daily for 7 days. 01/19/18 01/26/18  Varney Biles, MD  HYDROcodone-acetaminophen (NORCO/VICODIN) 5-325 MG tablet Take 1-2 tablets by mouth every 6 (six) hours as needed. 03/02/17   Law, Bea Graff, PA-C  ibuprofen (ADVIL,MOTRIN) 800 MG tablet  Take 1 tablet (800 mg total) by mouth 3 (three) times daily. 03/02/17   Law, Bea Graff, PA-C  naproxen (NAPROSYN) 375 MG tablet Take 1 tablet (375 mg total) by mouth 2 (two) times daily. 01/19/18   Varney Biles, MD  ondansetron (ZOFRAN) 4 MG tablet Take 1 tablet (4 mg total) by mouth every 6 (six) hours. 03/02/17   Frederica Kuster, PA-C  tamsulosin (FLOMAX) 0.4 MG CAPS capsule Take 1 capsule (0.4 mg total) by mouth daily after supper. 03/02/17   Frederica Kuster, PA-C    Family History History reviewed. No pertinent family history.  Social History Social History   Tobacco Use  . Smoking status: Current Every Day Smoker    Packs/day: 0.25    Years: 19.00    Pack years: 4.75    Types: Cigarettes  . Smokeless tobacco: Current User    Types: Snuff  . Tobacco comment: smokers 1ppwk trying to quit  Substance Use Topics  . Alcohol use: Yes    Comment: rarely  . Drug use: Yes    Frequency: 2.0 times per week    Types: Marijuana     Allergies   Penicillins   Review of Systems Review of Systems  Constitutional: Positive for activity change. Negative for chills and fever.  HENT: Positive for dental problem. Negative for sore throat, trouble swallowing and voice change.  Allergic/Immunologic: Negative for immunocompromised state.  Hematological: Does not bruise/bleed easily.     Physical Exam Updated Vital Signs BP (!) 138/99 (BP Location: Left Arm)   Pulse 78   Temp 98.3 F (36.8 C) (Oral)   Resp 16   Ht 6' (1.829 m)   Wt 68 kg   SpO2 98%   BMI 20.34 kg/m   Physical Exam  Constitutional: He is oriented to person, place, and time. He appears well-developed.  HENT:  Head: Atraumatic.  Patient has poor dentition diffusely and he is missing multiple teeth.  Patient has tenderness over tooth #30 and 31.  There is mild gingival edema with erythema.  No fluctuance appreciated.  No trismus.  Neck: Neck supple.  Cardiovascular: Normal rate.  Pulmonary/Chest: Effort  normal.  Neurological: He is alert and oriented to person, place, and time.  Skin: Skin is warm.  Nursing note and vitals reviewed.    ED Treatments / Results  Labs (all labs ordered are listed, but only abnormal results are displayed) Labs Reviewed - No data to display  EKG None  Radiology No results found.  Procedures Procedures (including critical care time)  Medications Ordered in ED Medications - No data to display   Initial Impression / Assessment and Plan / ED Course  I have reviewed the triage vital signs and the nursing notes.  Pertinent labs & imaging results that were available during my care of the patient were reviewed by me and considered in my medical decision making (see chart for details).     37 year old male comes in with chief complaint of toothache.  DDx includes: - Periapical tooth infection - Dental abscess - Gingivitis - Dental trauma - Pulpitis - Nerve root compression  Patient has history of substance abuse and chronically poor dentition.  Currently he does not use any drugs and he is immunocompetent.  Patient is noted to have no drooling, no trismus and is not toxic appearing.  Patient denies any headaches or neck pain.  VSS and within normal limits.  Patient is allergic to penicillin, therefore we will give him clindamycin.  He has a Pharmacist, community and he has agreed to follow-up with them in 1 week.  Strict ER return precautions have been discussed.  Final Clinical Impressions(s) / ED Diagnoses   Final diagnoses:  Dental infection  Infected dental caries    ED Discharge Orders         Ordered    clindamycin (CLEOCIN) 150 MG capsule  4 times daily     01/19/18 0828    naproxen (NAPROSYN) 375 MG tablet  2 times daily     01/19/18 7517           Varney Biles, MD 01/19/18 1121

## 2018-01-19 NOTE — ED Notes (Signed)
ED Provider at bedside. 

## 2018-01-19 NOTE — ED Notes (Signed)
rx x 2 at pharmacy

## 2018-01-19 NOTE — Discharge Instructions (Signed)
We saw in the ER for what appears to be severe gingival infection and dental infection. Please start taking the antibiotics prescribed and see the dentist in 1 week.  We have provided you with the information and on-call dentist, who you can contact if your dentist cannot get you in soon.

## 2018-04-16 ENCOUNTER — Ambulatory Visit (INDEPENDENT_AMBULATORY_CARE_PROVIDER_SITE_OTHER): Payer: BLUE CROSS/BLUE SHIELD | Admitting: Family Medicine

## 2018-04-16 ENCOUNTER — Encounter: Payer: Self-pay | Admitting: Family Medicine

## 2018-04-16 VITALS — BP 115/79 | HR 59 | Ht 73.0 in | Wt 151.5 lb

## 2018-04-16 DIAGNOSIS — R4589 Other symptoms and signs involving emotional state: Secondary | ICD-10-CM | POA: Diagnosis not present

## 2018-04-16 DIAGNOSIS — Z8679 Personal history of other diseases of the circulatory system: Secondary | ICD-10-CM | POA: Diagnosis not present

## 2018-04-16 DIAGNOSIS — Z818 Family history of other mental and behavioral disorders: Secondary | ICD-10-CM

## 2018-04-16 DIAGNOSIS — F17200 Nicotine dependence, unspecified, uncomplicated: Secondary | ICD-10-CM | POA: Insufficient documentation

## 2018-04-16 DIAGNOSIS — F1510 Other stimulant abuse, uncomplicated: Secondary | ICD-10-CM | POA: Insufficient documentation

## 2018-04-16 DIAGNOSIS — F1290 Cannabis use, unspecified, uncomplicated: Secondary | ICD-10-CM | POA: Insufficient documentation

## 2018-04-16 DIAGNOSIS — F4323 Adjustment disorder with mixed anxiety and depressed mood: Secondary | ICD-10-CM

## 2018-04-16 DIAGNOSIS — Z77110 Contact with and (suspected) exposure to air pollution: Secondary | ICD-10-CM

## 2018-04-16 DIAGNOSIS — Z9189 Other specified personal risk factors, not elsewhere classified: Secondary | ICD-10-CM

## 2018-04-16 NOTE — Patient Instructions (Signed)
I want you to do Cooks Hook-up and square breathing 4-5 times per day.  10 square breathing each time.    -Also we need to transition your brain into thinking more positively.  These tasks below are some things I want you to do every day 1)  write 3 new things that you are grateful for every day for 21 days  2)  exercise daily- walk for 15 minutes twice a day every day 3)  you are going to journal every day about one positive experience that you had 4)  meditate every day.  You can go on YouTube and look for 15-minute relaxation meditation or what ever.  But we need to make sure that you are in the moment and relaxing and deep breathing every day 5)  Write 1 positive email every day to praise someone in your life     - If you have insomnia or difficulty sleeping, this information is for you:  - Avoid caffeinated beverages after lunch,  no alcoholic beverages,  no eating within 2-3 hours of lying down,  avoid exposure to blue light before bed,  avoid daytime naps, and  needs to maintain a regular sleep schedule- go to sleep and wake up around the same time every night.   - Resolve concerns or worries before entering bedroom:  Discussed relaxation techniques with patient and to keep a journal to write down fears\ worries.  I suggested seeing a counselor for CBT.   - Recommend patient meditate or do deep breathing exercises to help relax.   Incorporate the use of white noise machines or listen to "sleep meditation music", or recordings of guided meditations for sleep from YouTube which are free, such as  "guided meditation for detachment from over thinking"  by Mayford Knife.

## 2018-04-16 NOTE — Progress Notes (Addendum)
New patient office visit note:  Impression and Recommendations:    1. Adjustment disorder with mixed anxiety and depressed mood   2. Non-suicidal depressed mood- thoughts but no actoin plan   3. History of Wolff-Parkinson-White (WPW) syndrome   4. Family history of major depression   5. 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 Exposure to air pollution-  various work exposures   8. Has poorly balanced diet     - Need for lab work in near future.  1. Encounter to Establish Care - Extensive discussion held with patient regarding establishing as a new patient.  Discussed policies and practices here at the clinic, and answered all questions about care team and health management during appointment.  - Discussed need for patient to continue to obtain management and screenings with all established specialists.  Educated patient at length about the critical importance of keeping health maintenance up to date.  - Participated in lengthy conversation and all questions were answered.  2. Personal Protective Equipment at Work - Various Work Exposures - Strongly advised patient to wear protective gear at work.  - Especially reviewed the need to wear adequate protective masks to protect the lungs.  3. Tobacco Abuse & Cessation Counseling - Patient has quit smoking cigarettes, but continues chewing tobacco.  - Advised patient to look into Nicorette patches or gum.  - Told pt to think seriously about quitting use of tobacco.  Told pt it is very important for his health and well being.    - Tobacco cessation instruction/counseling given:  counseled patient on the dangers of tobacco use, advised patient to stop using, and reviewed strategies to maximize success.  - Discussed with patient that there are multiple treatments to aid in quitting tobacco, however I explained none will work unless pt really want to quit.  - Told to  call 1-800-QUIT-NOW 516-737-6368) for free smoking cessation counseling and support, or pt can go online to www.heart.org - the American Heart Association website and search "quit smoking ".  4. Mood Management - Discussed causes of patient's depression and anxiety. - Extensive education and counseling performed today.  All questions were answered.  - Reviewed the "spokes of the wheel" of mood and health management.  Stressed the importance of ongoing prudent habits, including regular exercise, appropriate sleep hygiene, healthful dietary habits, and prayer/meditation to relax.   - Handout provided today.  Extensive counseling provided.  - Patient promises to call in to the clinic if he experiences alarming thoughts.  5. BMI Counseling - 19.99, on low end of normal Explained to patient what BMI refers to, and what it means medically.  Told patient to think about it as a "medical risk stratification measurement" and how increasing BMI, or very low BMI, is associated with increasing risk/ or worsening state of various diseases such as hypertension, hyperlipidemia, diabetes, premature OA, depression etc.  American Heart Association guidelines for healthy diet, basically Mediterranean diet, and exercise guidelines of 30 minutes 5 days per week or more discussed in detail.  Health counseling performed.  All questions answered.  6. Lifestyle & Preventative Health Maintenance - Advised patient to continue working toward exercising to improve overall mental, physical, and emotional health.    - Encouraged patient to engage in daily physical activity, especially a formal exercise routine.  Recommended that the patient eventually strive for at least 150 minutes of moderate cardiovascular activity per week according to  guidelines established by the [facility].   - Healthy dietary habits encouraged, including low-carb, and high amounts of lean protein in diet.   - Patient should also consume adequate  amounts of water.   Education and routine counseling performed. Handouts provided.  Pt was interviewed and evaluated by me in the clinic today for 32.5+ minutes, with over 50% time spent in face to face counseling of patients various medical conditions, treatment plans of those medical conditions including medicine management and lifestyle modification, strategies to improve health and well being; and in coordination of care. SEE ABOVE TREATMENT PLAN FOR DETAILS   Medications Discontinued During This Encounter  Medication Reason   albuterol (PROVENTIL HFA;VENTOLIN HFA) 108 (90 Base) MCG/ACT inhaler    benzonatate (TESSALON) 100 MG capsule Completed Course   chlorpheniramine-HYDROcodone (TUSSIONEX PENNKINETIC ER) 10-8 MG/5ML SUER Completed Course   HYDROcodone-acetaminophen (NORCO/VICODIN) 5-325 MG tablet Completed Course   ibuprofen (ADVIL,MOTRIN) 800 MG tablet Patient Preference   naproxen (NAPROSYN) 375 MG tablet Completed Course   ondansetron (ZOFRAN) 4 MG tablet Completed Course   tamsulosin (FLOMAX) 0.4 MG CAPS capsule       Gross side effects, risk and benefits, and alternatives of medications discussed with patient.  Patient is aware that all medications have potential side effects and we are unable to predict every side effect or drug-drug interaction that may occur.  Expresses verbal understanding and consents to current therapy plan and treatment regimen.  Return for f/up CPE near future- come fasting.  Please see AVS handed out to patient at the end of our visit for further patient instructions/ counseling done pertaining to today's office visit.    Note:  This document was prepared using Dragon voice recognition software and may include unintentional dictation errors.  This document serves as a record of services personally performed by Mellody Dance, DO. It was created on her behalf by Toni Amend, a trained medical scribe. The creation of this record is  based on the scribe's personal observations and the provider's statements to them.   I have reviewed the above medical documentation for accuracy and completeness and I concur.  Mellody Dance, DO 04/16/2018 9:32 PM       ----------------------------------------------------------------------------------------------------------------------------    Subjective:    Chief complaint:   Chief Complaint  Patient presents with   Establish Care     HPI: Julian Clark is a pleasant 38 y.o. male who presents to [facility] today to review their medical history with me and establish care.   I asked the patient to review their chronic problem list with me to ensure everything was updated and accurate.    All recent office visits with other providers, any medical records that patient brought in etc  - I reviewed today.     We asked pt to get [place] their medical records from [facility] providers/ specialists that they had seen within the past 3-5 years- if they are in private practice and/or do not work for [employer], [facility], [place], [place] or [employer] owned practice.  Told them to call their specialists to clarify this if they are not sure.    Has never had a PCP.  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 Notes he never wears masks or protection at work.  States he often "coughs up black stuff in the shower."  Has an 41 year old daughter.  Patient is a single dad. Currently taking care of his daughter alone.  He has family in the area, but is not very close with them.  States he is sexually active every chance he gets. Been with the same person for 6-7 years.  His girlfriend had to have her male  organs removed. Does not use protection otherwise.  - Tobacco Use Former every day smoker, 30+ pack-years.  Smoked about 1.5 ppd for a good 22-23 years.  Quit smoking two weeks before New Year's as he developed a pretty bad cough.  States he's about a month into quitting smoking cigarettes now.  He still currently dips snuff tobacco.  "I have found I'm not addicted to smoking, I'm addicted to tobacco."  "I'd love to be tobacco-free to be completely honest with it."  He has never tried patches.  - Alcohol Use States he has a glass of wine per night.  - Drug Use Uses marijuana recreationally, daily. Patient states he isn't sure why he smokes it.  Consumes large amounts of coffee.  Family History Grandmother, all his uncles, on the mother's side of the family, they have "a form of cancer, or a heart disease, or an eye issue, whether it be glaucoma, cataracts."  Most of the men have been treated for or died from a form of cancer, pancreatic, prostate, colon.  His grandfather has a colostomy bag.  No first-degree relatives with cancer.  Knows nothing of his father's side of the family; has never met him.  Past Medical History Thinks he's very unhealthy.  He not satisfied with his weight.  He desires to gain about 15 lbs. Says he feels a little underweight, not necessarily "too thin."  Doesn't think he's ever had lab work done before.  - WPW Syndrome Thinks he was diagnosed with WPW at age 87.  Every so often, he feels like his chest is running a marathon and he's not, and about to pass out.  - Experiencing Depression, Mood Concerns, Anxiety Says he doesn't like to put too much thought into any kind of suicidal ideations because he has a kid.  "I have to move on, I have other stuff to do so I can't focus on that."  Feels he has a lot of both depression and anxiety.  This week, he's been feeling down because he realized that the people he thought he could count on in his life  are not reliable.  Says meanwhile, he's always the guy there to be relied on.  "I seem to be able to fix everybody else's problems, but I can't fix mine."  He's been less spiritual in the past couple of weeks, and feels "that's probably compacted with what's going on."   Wt Readings from Last 3 Encounters:  04/16/18 151 lb 8 oz (68.7 kg)  01/19/18 150 lb (68 kg)  03/02/17 150 lb (68 kg)   BP Readings from Last 3 Encounters:  04/16/18 115/79  01/19/18 (!) 138/99  05/25/17 118/75   Pulse Readings from Last 3 Encounters:  04/16/18 (!) 59  01/19/18 78  05/25/17 (!) 50   BMI Readings from Last 3 Encounters:  04/16/18 19.99 kg/m  01/19/18 20.34 kg/m  03/02/17 20.34 kg/m    Patient Care Team    Relationship Specialty Notifications Start End  Mellody Dance, DO PCP -  General Family Medicine  04/16/18     Patient Active Problem List   Diagnosis Date Noted   Non-suicidal depressed mood 04/16/2018   Adjustment disorder with mixed anxiety and depressed mood 04/16/2018   Family history of major depression 04/16/2018   Caffeine abuse ([employer]) 04/16/2018   Nicotine dependence, uncomplicated 19/50/9326   Marijuana smoker, continuous 04/16/2018   Has poorly balanced diet 04/16/2018   Exposure to air pollution-  various work exposures 04/16/2018       As reported by pt:  Past Medical History:  Diagnosis Date   Photodermatitis due to sun    scaly patches   Soft tissue mass    ENLARGED LEFT UPPER BACK-- SOFTBALL SIZE   Wears contact lenses    WPW (Wolff-Parkinson-White syndrome) no old ekg available with [facility] called ekg department 02-02-2015   dx age 9--  states no issues or symptoms since teen yrs and was told would grow out of it     Past Surgical History:  Procedure Laterality Date   MASS EXCISION N/A 02/04/2015   Procedure: REMOVAL SOFT TISSUE  MASSES ON BACK X 2;  Surgeon: Jackolyn Confer, MD;  Location: [facility];  Service:  General;  Laterality: N/A;   NO PAST SURGERIES       Family History  Problem Relation Age of Onset   Prostate cancer Other    Pancreatic cancer Other    Prostate cancer Maternal Uncle    Colon cancer Maternal Uncle    Pancreatic cancer Maternal Uncle    Colon cancer Maternal Grandfather      Social History   Substance and Sexual Activity  Drug Use Yes   Frequency: 2.0 times per week   Types: Marijuana     Social History   Substance and Sexual Activity  Alcohol Use Yes   Comment: rarely     Social History   Tobacco Use  Smoking Status Former Smoker   Packs/day: 1.50   Years: 22.00   Pack years: 33.00   Types: Cigarettes   Last attempt to quit: 03/06/2018   Years since quitting: 0.1  Smokeless Tobacco Current User   Types: Snuff  Tobacco Comment   patient is trying to quit x 1 month     No outpatient medications have been marked as taking for the 04/16/18 encounter (Office Visit) with Mellody Dance, DO.    Allergies: Penicillins   Review of Systems  Constitutional: Negative for chills, diaphoresis, fever, malaise/fatigue and weight loss.  HENT: Negative for congestion, sore throat and tinnitus.   Eyes: Negative for blurred vision, double vision and photophobia.  Respiratory: Negative for cough and wheezing.   Cardiovascular: Negative for chest pain and palpitations.  Gastrointestinal: Negative for blood in stool, diarrhea, nausea and vomiting.  Genitourinary: Negative for dysuria, frequency and urgency.  Musculoskeletal: Positive for joint pain (chronic) and myalgias (chronic).  Skin: Negative for itching and rash.  Neurological: Negative for dizziness, focal weakness, weakness and headaches.  Endo/Heme/Allergies: Negative for environmental allergies and polydipsia. Does not bruise/bleed easily.  Psychiatric/Behavioral: Positive for depression (chronic, non suicidal depressed mood,) and suicidal ideas (thoughts but no action plan).  Negative for memory loss. The patient is nervous/anxious (chronic). The patient does not have insomnia.       Objective:   Blood pressure 115/79, pulse (!) 59, height 6' 1"  (1.854 m), weight 151 lb 8 oz (68.7 kg), SpO2 100 %. Body mass index is 19.99 kg/m. General: Well Developed, well nourished, and in no  acute distress.  Neuro: Alert and oriented x3, extra-ocular muscles intact, sensation grossly intact.  HEENT:[place]/AT, PERRLA, neck supple, No carotid bruits Skin: no gross rashes  Cardiac: Regular rate and rhythm Respiratory: Essentially clear to auscultation bilaterally. Not using accessory muscles, speaking in full sentences.  Abdominal: not grossly distended Musculoskeletal: Ambulates w/o diff, FROM * 4 ext.  Vasc: less 2 sec cap RF, warm and pink  Psych:  No HI/SI, judgement and insight good, Euthymic mood. Full Affect.    No results found for this or any previous visit (from the past 2160 hour(s)).

## 2018-04-18 ENCOUNTER — Other Ambulatory Visit: Payer: Self-pay

## 2018-04-18 DIAGNOSIS — Z0000 Encounter for general adult medical examination without abnormal findings: Secondary | ICD-10-CM

## 2018-05-28 ENCOUNTER — Other Ambulatory Visit (INDEPENDENT_AMBULATORY_CARE_PROVIDER_SITE_OTHER): Payer: BLUE CROSS/BLUE SHIELD

## 2018-05-28 DIAGNOSIS — Z0000 Encounter for general adult medical examination without abnormal findings: Secondary | ICD-10-CM

## 2018-05-29 LAB — CBC WITH DIFFERENTIAL/PLATELET
BASOS: 2 %
Basophils Absolute: 0.1 10*3/uL (ref 0.0–0.2)
EOS (ABSOLUTE): 0.2 10*3/uL (ref 0.0–0.4)
EOS: 2 %
HEMATOCRIT: 44.4 % (ref 37.5–51.0)
Hemoglobin: 14.1 g/dL (ref 13.0–17.7)
Immature Grans (Abs): 0 10*3/uL (ref 0.0–0.1)
Immature Granulocytes: 0 %
Lymphocytes Absolute: 3 10*3/uL (ref 0.7–3.1)
Lymphs: 35 %
MCH: 28.4 pg (ref 26.6–33.0)
MCHC: 31.8 g/dL (ref 31.5–35.7)
MCV: 89 fL (ref 79–97)
MONOS ABS: 0.6 10*3/uL (ref 0.1–0.9)
Monocytes: 7 %
Neutrophils Absolute: 4.7 10*3/uL (ref 1.4–7.0)
Neutrophils: 54 %
Platelets: 331 10*3/uL (ref 150–450)
RBC: 4.97 x10E6/uL (ref 4.14–5.80)
RDW: 12.5 % (ref 11.6–15.4)
WBC: 8.6 10*3/uL (ref 3.4–10.8)

## 2018-05-29 LAB — COMPREHENSIVE METABOLIC PANEL
ALBUMIN: 4.6 g/dL (ref 4.0–5.0)
ALT: 20 IU/L (ref 0–44)
AST: 21 IU/L (ref 0–40)
Albumin/Globulin Ratio: 1.9 (ref 1.2–2.2)
Alkaline Phosphatase: 62 IU/L (ref 39–117)
BUN / CREAT RATIO: 14 (ref 9–20)
BUN: 13 mg/dL (ref 6–20)
Bilirubin Total: 0.4 mg/dL (ref 0.0–1.2)
CALCIUM: 9.2 mg/dL (ref 8.7–10.2)
CO2: 22 mmol/L (ref 20–29)
CREATININE: 0.92 mg/dL (ref 0.76–1.27)
Chloride: 99 mmol/L (ref 96–106)
GFR calc non Af Amer: 106 mL/min/{1.73_m2} (ref >59)
GFR, EST AFRICAN AMERICAN: 122 mL/min/{1.73_m2} (ref >59)
GLOBULIN, TOTAL: 2.4 g/dL (ref 1.5–4.5)
Glucose: 80 mg/dL (ref 65–99)
Potassium: 4.3 mmol/L (ref 3.5–5.2)
SODIUM: 140 mmol/L (ref 134–144)
TOTAL PROTEIN: 7 g/dL (ref 6.0–8.5)

## 2018-05-29 LAB — VITAMIN D 25 HYDROXY (VIT D DEFICIENCY, FRACTURES): Vit D, 25-Hydroxy: 18.7 ng/mL — ABNORMAL LOW (ref 30.0–100.0)

## 2018-05-29 LAB — HEMOGLOBIN A1C
Est. average glucose Bld gHb Est-mCnc: 114 mg/dL
Hgb A1c MFr Bld: 5.6 % (ref 4.8–5.6)

## 2018-05-29 LAB — LIPID PANEL
CHOL/HDL RATIO: 3.5 ratio (ref 0.0–5.0)
CHOLESTEROL TOTAL: 179 mg/dL (ref 100–199)
HDL: 51 mg/dL (ref >39)
LDL Calculated: 112 mg/dL — ABNORMAL HIGH (ref 0–99)
TRIGLYCERIDES: 78 mg/dL (ref 0–149)
VLDL Cholesterol Cal: 16 mg/dL (ref 5–40)

## 2018-05-29 LAB — T4, FREE: FREE T4: 1.05 ng/dL (ref 0.82–1.77)

## 2018-05-29 LAB — TSH: TSH: 1.06 u[IU]/mL (ref 0.450–4.500)

## 2018-06-20 ENCOUNTER — Encounter: Payer: BLUE CROSS/BLUE SHIELD | Admitting: Family Medicine

## 2018-07-15 ENCOUNTER — Ambulatory Visit: Payer: BLUE CROSS/BLUE SHIELD | Admitting: Family Medicine

## 2018-08-29 ENCOUNTER — Encounter: Payer: Self-pay | Admitting: Family Medicine

## 2018-08-29 ENCOUNTER — Other Ambulatory Visit: Payer: Self-pay

## 2018-08-29 ENCOUNTER — Ambulatory Visit (INDEPENDENT_AMBULATORY_CARE_PROVIDER_SITE_OTHER): Payer: BLUE CROSS/BLUE SHIELD | Admitting: Family Medicine

## 2018-08-29 VITALS — BP 111/69 | HR 50 | Temp 98.7°F | Ht 71.0 in | Wt 151.0 lb

## 2018-08-29 DIAGNOSIS — Z7189 Other specified counseling: Secondary | ICD-10-CM

## 2018-08-29 DIAGNOSIS — R1031 Right lower quadrant pain: Secondary | ICD-10-CM | POA: Diagnosis not present

## 2018-08-29 DIAGNOSIS — Z0001 Encounter for general adult medical examination with abnormal findings: Secondary | ICD-10-CM | POA: Diagnosis not present

## 2018-08-29 DIAGNOSIS — N509 Disorder of male genital organs, unspecified: Secondary | ICD-10-CM | POA: Diagnosis not present

## 2018-08-29 DIAGNOSIS — Q828 Other specified congenital malformations of skin: Secondary | ICD-10-CM | POA: Insufficient documentation

## 2018-08-29 NOTE — Progress Notes (Signed)
Male physical  Impression and Recommendations:    1. Encounter for general adult medical examination with abnormal findings   2. Groin pain, right   3. Testicular abnormality-  R --> acute on chronic     1) Anticipatory Guidance: Discussed importance of wearing a seatbelt while driving, not texting while driving;   sunscreen when outside along with skin surveillance; eating a balanced and modest diet; physical activity at least 25 minutes per day or 150 min/ week moderate to intense activity.  2) Immunizations / Screenings / Labs:  All immunizations are up-to-date per recommendations or will be updated today. Patient is due for dental and vision screens which pt will schedule independently. Will obtain CBC, CMP, HgA1c, Lipid panel, TSH and vit D when fasting, if not already done recently.   Weight:  BMI meaning discussed with patient.  Discussed goal of losing 5-10% of current body weight which would improve overall feelings of well being and improve objective health data. Improve nutrient density of diet through increasing intake of fruits and vegetables and decreasing saturated fats, white flour products and refined sugars.   3) separate from CPE today- assessed R groin pain- not current, but slight buldge apprec- thus due to sx- will refer to Gen Sx for eval of possible ing hernia.  Avoid heavy lifting- nothing greater than 10lbs until seen by Sx. Red flag symptoms discussed with patient and when he should urgently proceed to ER was reviewed with him   Orders Placed This Encounter  Procedures  . Ambulatory referral to General Surgery    Please see AVS handed out to patient at the end of our visit for further patient instructions/ counseling done pertaining to today's office visit.  Follow-up preventative CPE in 1 year. Follow-up office visit pending lab work.   -- F/up sooner for chronic care management-6 months or sooner if acute issues  Marjory Sneddon, DO 08/29/2018  6:10 PM    Subjective:    CC: [facility] has acute on chronic concerns today  HPI: Julian Clark is a 38 y.o. male who presents to [place] at [facility] today for a yearly health maintenance exam.     Health Maintenance Summary Reviewed and updated, unless pt declines services.  Colonoscopy:   Unnecessary  Tobacco History Reviewed:   Y CT scan for screening lung CA:   n/a Alcohol:    No concerns, no excessive use per pt Exercise Habits:   Not meeting AHA guidelines STD concerns:   none Drug Use:   None Testicular/penile concerns:  Yes, pt states that his R testicule can become enlrged from time to time versus the L.  At times painful.  Not sure if it's after lfiting or not- not sure what causes it to become"swollen and be tender".  Recently had a few days of it being painful- now it is N- no pain.  Pt does not apprec abn now   Immunization History  Administered Date(s) Administered  . Tdap 11/20/2011, 10/30/2015    Health Maintenance  Topic Date Due  . INFLUENZA VACCINE  10/19/2018  . TETANUS/TDAP  10/29/2025  . HIV Screening  Completed      Wt Readings from Last 3 Encounters:  08/29/18 151 lb (68.5 kg)  04/16/18 151 lb 8 oz (68.7 kg)  01/19/18 150 lb (68 kg)   BP Readings from Last 3 Encounters:  08/29/18 111/69  04/16/18 115/79  01/19/18 (!) 138/99   Pulse Readings from Last  3 Encounters:  08/29/18 (!) 50  04/16/18 (!) 59  01/19/18 78    Patient Active Problem List   Diagnosis Date Noted  . Non-suicidal depressed mood 04/16/2018  . Adjustment disorder with mixed anxiety and depressed mood 04/16/2018  . Family history of major depression 04/16/2018  . 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 Nicotine dependence, uncomplicated 69/62/9528  . Marijuana smoker, continuous 04/16/2018  . Has poorly balanced diet 04/16/2018  . Exposure to air pollution-  various work exposures 04/16/2018    Past Medical History:  Diagnosis Date  .  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 Soft tissue mass    ENLARGED LEFT UPPER BACK-- SOFTBALL SIZE  . Wears contact lenses   . WPW (Wolff-Parkinson-White syndrome) no old ekg available with [facility] called ekg department 02-02-2015   dx age 41--  states no issues or symptoms since teen yrs and was told would grow out of it    Past Surgical History:  Procedure Laterality Date  . MASS EXCISION N/A 02/04/2015   Procedure: REMOVAL SOFT TISSUE  MASSES ON BACK X 2;  Surgeon: Jackolyn Confer, MD;  Location: [facility];  Service: General;  Laterality: N/A;  . NO PAST SURGERIES      Family History  Problem Relation Age of Onset  . Prostate cancer Other   . Pancreatic cancer Other   . Prostate cancer Maternal Uncle   . Colon cancer Maternal Uncle   . Pancreatic cancer Maternal Uncle   . Colon cancer Maternal Grandfather     Social History   Substance and Sexual Activity  Drug Use Yes  . Frequency: 2.0 times per week  . Types: Marijuana  ,  Social History   Substance and Sexual Activity  Alcohol Use Yes   Comment: rarely  ,  Social History   Tobacco Use  Smoking Status Former Smoker  . Packs/day: 1.50  . Years: 22.00  . Pack years: 33.00  . Types: Cigarettes  . Quit date: 03/06/2018  . Years since quitting: 0.4  Smokeless Tobacco Current User  . Types: Snuff  Tobacco Comment   patient is trying to quit x 1 month  ,  Social History   Substance and Sexual Activity  Sexual Activity Yes  . Birth control/protection: None    Patient's Medications   No medications on file    ALLergy:  Penicillins   Review of Systems: General:   Denies fever, chills, unexplained weight loss.  Optho/Auditory:   Denies visual changes, blurred vision/LOV Respiratory:   Denies SOB, DOE more than baseline levels.  Cardiovascular:   Denies chest pain, palpitations, new onset peripheral edema  Gastrointestinal:   Denies nausea, vomiting, diarrhea.   Genitourinary: Denies dysuria, freq/ urgency, flank pain or discharge from genitals.  Endocrine:     Denies hot or cold intolerance, polyuria, polydipsia. Musculoskeletal:   Denies unexplained myalgias, joint swelling, unexplained arthralgias, gait problems.  Skin:  Denies rash, suspicious lesions Neurological:     Denies dizziness, unexplained weakness, numbness  Psychiatric/Behavioral:   Denies mood changes, suicidal or homicidal ideations, hallucinations    Objective:     Blood pressure 111/69, pulse (!) 50, temperature 98.7 F (37.1 C), height 5\' 11"  (1.803 m), weight 151 lb (68.5 kg), SpO2 99 %. Body mass index is 21.06 kg/m. General Appearance:    Alert, cooperative, no distress, appears stated age  Head:    Normocephalic, without obvious abnormality, atraumatic  Eyes:    PERRL,  conjunctiva/corneas clear, EOM's intact, fundi    benign, both eyes  Ears:    Normal TM's and external ear canals, both ears  Nose:   Nares normal, septum midline, mucosa normal, no drainage    or sinus tenderness  Throat:   Lips w/o lesion, mucosa moist, and tongue normal; teeth and   gums normal  Neck:   Supple, symmetrical, trachea midline, no adenopathy;    thyroid:  no enlargement/tenderness/nodules; no carotid   bruit or JVD  Back:     Symmetric, no curvature, ROM normal, no CVA tenderness  Lungs:     Clear to auscultation bilaterally, respirations unlabored, no       Wh/ R/ R  Chest Wall:    No tenderness or gross deformity; normal excursion   Heart:    Regular rate and rhythm, S1 and S2 normal, no murmur, rub   or gallop  Abdomen:     Soft, non-tender, bowel sounds active all four quadrants, NO   G/R/R, no masses, no organomegaly  Genitalia:    Ext genitalia: without acute lesion, no penile rash or discharge, slight buldge external ring R side, no tissue extravasation, no pain.. Right testy high riding and smaller than left.  Nontender to palpation bilaterally.  No abnormalities otherwise   Rectal:   Not done  Extremities:   Extremities normal, atraumatic, no cyanosis or gross edema  Pulses:   2+ and symmetric all extremities  Skin:   Warm, dry, Skin color, texture, turgor normal, diffuse skin lesions- Darier's dx  M-Sk:   Ambulates * 4 w/o difficulty, no gross deformities, tone WNL  Neurologic:   CNII-XII intact, normal strength, sensation and reflexes    Throughout Psych:  No HI/SI, judgement and insight good, Euthymic mood. Full Affect.

## 2018-08-29 NOTE — Patient Instructions (Signed)
Preventive Care for Adults, Male A healthy lifestyle and preventive care can promote health and wellness. Preventive health guidelines for men include the following key practices:  A routine yearly physical is a good way to check with your health care provider about your health and preventative screening. It is a chance to share any concerns and updates on your health and to receive a thorough exam.  Visit your dentist for a routine exam and preventative care every 6 months. Brush your teeth twice a day and floss once a day. Good oral hygiene prevents tooth decay and gum disease.  The frequency of eye exams is based on your age, health, family medical history, use of contact lenses, and other factors. Follow your health care provider's recommendations for frequency of eye exams.  Eat a healthy diet. Foods such as vegetables, fruits, whole grains, low-fat dairy products, and lean protein foods contain the nutrients you need without too many calories. Decrease your intake of foods high in solid fats, added sugars, and salt. Eat the right amount of calories for you.Get information about a proper diet from your health care provider, if necessary.  Regular physical exercise is one of the most important things you can do for your health. Most adults should get at least 150 minutes of moderate-intensity exercise (any activity that increases your heart rate and causes you to sweat) each week. In addition, most adults need muscle-strengthening exercises on 2 or more days a week.  Maintain a healthy weight. The body mass index (BMI) is a screening tool to identify possible weight problems. It provides an estimate of body fat based on height and weight. Your health care provider can find your BMI and can help you achieve or maintain a healthy weight.For adults 20 years and older:  A BMI below 18.5 is considered underweight.  A BMI of 18.5 to 24.9 is normal.  A BMI of 25 to 29.9 is considered  overweight.  A BMI of 30 and above is considered obese.  Maintain normal blood lipids and cholesterol levels by exercising and minimizing your intake of saturated fat. Eat a balanced diet with plenty of fruit and vegetables. Blood tests for lipids and cholesterol should begin at age 20 and be repeated every 5 years. If your lipid or cholesterol levels are high, you are over 50, or you are at high risk for heart disease, you may need your cholesterol levels checked more frequently.Ongoing high lipid and cholesterol levels should be treated with medicines if diet and exercise are not working.  If you smoke, find out from your health care provider how to quit. If you do not use tobacco, do not start.  Lung cancer screening is recommended for adults aged 55-80 years who are at high risk for developing lung cancer because of a history of smoking. A yearly low-dose CT scan of the lungs is recommended for people who have at least a 30-pack-year history of smoking and are a current smoker or have quit within the past 15 years. A pack year of smoking is smoking an average of 1 pack of cigarettes a day for 1 year (for example: 1 pack a day for 30 years or 2 packs a day for 15 years). Yearly screening should continue until the smoker has stopped smoking for at least 15 years. Yearly screening should be stopped for people who develop a health problem that would prevent them from having lung cancer treatment.  If you choose to drink alcohol, do not have more   than 2 drinks per day. One drink is considered to be 12 ounces (355 mL) of beer, 5 ounces (148 mL) of wine, or 1.5 ounces (44 mL) of liquor.  Avoid use of street drugs. Do not share needles with anyone. Ask for help if you need support or instructions about stopping the use of drugs.  High blood pressure causes heart disease and increases the risk of stroke. Your blood pressure should be checked at least every 1-2 years. Ongoing high blood pressure should be  treated with medicines, if weight loss and exercise are not effective.  If you are 45-79 years old, ask your health care provider if you should take aspirin to prevent heart disease.  Diabetes screening is done by taking a blood sample to check your blood glucose level after you have not eaten for a certain period of time (fasting). If you are not overweight and you do not have risk factors for diabetes, you should be screened once every 3 years starting at age 45. If you are overweight or obese and you are 40-70 years of age, you should be screened for diabetes every year as part of your cardiovascular risk assessment.  Colorectal cancer can be detected and often prevented. Most routine colorectal cancer screening begins at the age of 50 and continues through age 75. However, your health care provider may recommend screening at an earlier age if you have risk factors for colon cancer. On a yearly basis, your health care provider may provide home test kits to check for hidden blood in the stool. Use of a small camera at the end of a tube to directly examine the colon (sigmoidoscopy or colonoscopy) can detect the earliest forms of colorectal cancer. Talk to your health care provider about this at age 50, when routine screening begins. Direct exam of the colon should be repeated every 5-10 years through age 75, unless early forms of precancerous polyps or small growths are found.  People who are at an increased risk for hepatitis B should be screened for this virus. You are considered at high risk for hepatitis B if:  You were born in a country where hepatitis B occurs often. Talk with your health care provider about which countries are considered high risk.  Your parents were born in a high-risk country and you have not received a shot to protect against hepatitis B (hepatitis B vaccine).  You have HIV or AIDS.  You use needles to inject street drugs.  You live with, or have sex with, someone who  has hepatitis B.  You are a man who has sex with other men (MSM).  You get hemodialysis treatment.  You take certain medicines for conditions such as cancer, organ transplantation, and autoimmune conditions.  Hepatitis C blood testing is recommended for all people born from 1945 through 1965 and any individual with known risks for hepatitis C.  Practice safe sex. Use condoms and avoid high-risk sexual practices to reduce the spread of sexually transmitted infections (STIs). STIs include gonorrhea, chlamydia, syphilis, trichomonas, herpes, HPV, and human immunodeficiency virus (HIV). Herpes, HIV, and HPV are viral illnesses that have no cure. They can result in disability, cancer, and death.  If you are a man who has sex with other men, you should be screened at least once per year for:  HIV.  Urethral, rectal, and pharyngeal infection of gonorrhea, chlamydia, or both.  If you are at risk of being infected with HIV, it is recommended that you take a   prescription medicine daily to prevent HIV infection. This is called preexposure prophylaxis (PrEP). You are considered at risk if:  You are a man who has sex with other men (MSM) and have other risk factors.  You are a heterosexual man, are sexually active, and are at increased risk for HIV infection.  You take drugs by injection.  You are sexually active with a partner who has HIV.  Talk with your health care provider about whether you are at high risk of being infected with HIV. If you choose to begin PrEP, you should first be tested for HIV. You should then be tested every 3 months for as long as you are taking PrEP.  A one-time screening for abdominal aortic aneurysm (AAA) and surgical repair of large AAAs by ultrasound are recommended for men ages 65 to 75 years who are current or former smokers.  Healthy men should no longer receive prostate-specific antigen (PSA) blood tests as part of routine cancer screening. Talk with your health  care provider about prostate cancer screening.  Testicular cancer screening is not recommended for adult males who have no symptoms. Screening includes self-exam, a health care provider exam, and other screening tests. Consult with your health care provider about any symptoms you have or any concerns you have about testicular cancer.  Use sunscreen. Apply sunscreen liberally and repeatedly throughout the day. You should seek shade when your shadow is shorter than you. Protect yourself by wearing long sleeves, pants, a wide-brimmed hat, and sunglasses year round, whenever you are outdoors.  Once a month, do a whole-body skin exam, using a mirror to look at the skin on your back. Tell your health care provider about new moles, moles that have irregular borders, moles that are larger than a pencil eraser, or moles that have changed in shape or color.  Stay current with required vaccines (immunizations).  Influenza vaccine. All adults should be immunized every year.  Tetanus, diphtheria, and acellular pertussis (Td, Tdap) vaccine. An adult who has not previously received Tdap or who does not know his vaccine status should receive 1 dose of Tdap. This initial dose should be followed by tetanus and diphtheria toxoids (Td) booster doses every 10 years. Adults with an unknown or incomplete history of completing a 3-dose immunization series with Td-containing vaccines should begin or complete a primary immunization series including a Tdap dose. Adults should receive a Td booster every 10 years.  Varicella vaccine. An adult without evidence of immunity to varicella should receive 2 doses or a second dose if he has previously received 1 dose.  Human papillomavirus (HPV) vaccine. Males aged 11-21 years who have not received the vaccine previously should receive the 3-dose series. Males aged 22-26 years may be immunized. Immunization is recommended through the age of 26 years for any male who has sex with males  and did not get any or all doses earlier. Immunization is recommended for any person with an immunocompromised condition through the age of 26 years if he did not get any or all doses earlier. During the 3-dose series, the second dose should be obtained 4-8 weeks after the first dose. The third dose should be obtained 24 weeks after the first dose and 16 weeks after the second dose.  Zoster vaccine. One dose is recommended for adults aged 60 years or older unless certain conditions are present.  Measles, mumps, and rubella (MMR) vaccine. Adults born before 1957 generally are considered immune to measles and mumps. Adults born in 1957   or later should have 1 or more doses of MMR vaccine unless there is a contraindication to the vaccine or there is laboratory evidence of immunity to each of the three diseases. A routine second dose of MMR vaccine should be obtained at least 28 days after the first dose for students attending postsecondary schools, health care workers, or international travelers. People who received inactivated measles vaccine or an unknown type of measles vaccine during 1963-1967 should receive 2 doses of MMR vaccine. People who received inactivated mumps vaccine or an unknown type of mumps vaccine before 1979 and are at high risk for mumps infection should consider immunization with 2 doses of MMR vaccine. Unvaccinated health care workers born before 1957 who lack laboratory evidence of measles, mumps, or rubella immunity or laboratory confirmation of disease should consider measles and mumps immunization with 2 doses of MMR vaccine or rubella immunization with 1 dose of MMR vaccine.  Pneumococcal 13-valent conjugate (PCV13) vaccine. When indicated, a person who is uncertain of his immunization history and has no record of immunization should receive the PCV13 vaccine. All adults 65 years of age and older should receive this vaccine. An adult aged 19 years or older who has certain medical  conditions and has not been previously immunized should receive 1 dose of PCV13 vaccine. This PCV13 should be followed with a dose of pneumococcal polysaccharide (PPSV23) vaccine. Adults who are at high risk for pneumococcal disease should obtain the PPSV23 vaccine at least 8 weeks after the dose of PCV13 vaccine. Adults older than 38 years of age who have normal immune system function should obtain the PPSV23 vaccine dose at least 1 year after the dose of PCV13 vaccine.  Pneumococcal polysaccharide (PPSV23) vaccine. When PCV13 is also indicated, PCV13 should be obtained first. All adults aged 65 years and older should be immunized. An adult younger than age 65 years who has certain medical conditions should be immunized. Any person who resides in a nursing home or long-term care facility should be immunized. An adult smoker should be immunized. People with an immunocompromised condition and certain other conditions should receive both PCV13 and PPSV23 vaccines. People with human immunodeficiency virus (HIV) infection should be immunized as soon as possible after diagnosis. Immunization during chemotherapy or radiation therapy should be avoided. Routine use of PPSV23 vaccine is not recommended for American Indians, [place] Natives, or people younger than 65 years unless there are medical conditions that require PPSV23 vaccine. When indicated, people who have unknown immunization and have no record of immunization should receive PPSV23 vaccine. One-time revaccination 5 years after the first dose of PPSV23 is recommended for people aged 19-64 years who have chronic kidney failure, nephrotic syndrome, asplenia, or immunocompromised conditions. People who received 1-2 doses of PPSV23 before age 65 years should receive another dose of PPSV23 vaccine at age 65 years or later if at least 5 years have passed since the previous dose. Doses of PPSV23 are not needed for people immunized with PPSV23 at or after age 65  years.  Meningococcal vaccine. Adults with asplenia or persistent complement component deficiencies should receive 2 doses of quadrivalent meningococcal conjugate (MenACWY-D) vaccine. The doses should be obtained at least 2 months apart. Microbiologists working with certain meningococcal bacteria, military recruits, people at risk during an outbreak, and people who travel to or live in countries with a high rate of meningitis should be immunized. A first-year college student up through age 21 years who is living in a residence hall should receive a   dose if he did not receive a dose on or after his 16th birthday. Adults who have certain high-risk conditions should receive one or more doses of vaccine.  Hepatitis A vaccine. Adults who wish to be protected from this disease, have chronic liver disease, work with hepatitis A-infected animals, work in hepatitis A research labs, or travel to or work in countries with a high rate of hepatitis A should be immunized. Adults who were previously unvaccinated and who anticipate close contact with an international adoptee during the first 60 days after arrival in the [place] from a country with a high rate of hepatitis A should be immunized.  Hepatitis B vaccine. Adults should be immunized if they wish to be protected from this disease, are under age 59 years and have diabetes, have chronic liver disease, have had more than one sex partner in the past 6 months, may be exposed to blood or other infectious body fluids, are household contacts or sex partners of hepatitis B positive people, are clients or workers in certain care facilities, or travel to or work in countries with a high rate of hepatitis B.  Haemophilus influenzae type b (Hib) vaccine. A previously unvaccinated person with asplenia or sickle cell disease or having a scheduled splenectomy should receive 1 dose of Hib vaccine. Regardless of previous immunization, a recipient of a hematopoietic stem cell  transplant should receive a 3-dose series 6-12 months after his successful transplant. Hib vaccine is not recommended for adults with HIV infection. Preventive Service / Frequency Ages 19 to 39  Blood pressure check.** / Every 3-5 years.  Lipid and cholesterol check.** / Every 5 years beginning at age 20.  Hepatitis C blood test.** / For any individual with known risks for hepatitis C.  Skin self-exam. / Monthly.  Influenza vaccine. / Every year.  Tetanus, diphtheria, and acellular pertussis (Tdap, Td) vaccine.** / Consult your health care provider. 1 dose of Td every 10 years.  Varicella vaccine.** / Consult your health care provider.  HPV vaccine. / 3 doses over 6 months, if 26 or younger.  Measles, mumps, rubella (MMR) vaccine.** / You need at least 1 dose of MMR if you were born in 1957 or later. You may also need a second dose.  Pneumococcal 13-valent conjugate (PCV13) vaccine.** / Consult your health care provider.  Pneumococcal polysaccharide (PPSV23) vaccine.** / 1 to 2 doses if you smoke cigarettes or if you have certain conditions.  Meningococcal vaccine.** / 1 dose if you are age 19 to 21 years and a first-year college student living in a residence hall, or have one of several medical conditions. You may also need additional booster doses.  Hepatitis A vaccine.** / Consult your health care provider.  Hepatitis B vaccine.** / Consult your health care provider.  Haemophilus influenzae type b (Hib) vaccine.** / Consult your health care provider. Ages 40 to 64  Blood pressure check.** / Every year.  Lipid and cholesterol check.** / Every 5 years beginning at age 20.  Lung cancer screening. / Every year if you are aged 55-80 years and have a 30-pack-year history of smoking and currently smoke or have quit within the past 15 years. Yearly screening is stopped once you have quit smoking for at least 15 years or develop a health problem that would prevent you from having  lung cancer treatment.  Fecal occult blood test (FOBT) of stool. / Every year beginning at age 50 and continuing until age 75. You may not have to do   this test if you get a colonoscopy every 10 years.  Flexible sigmoidoscopy** or colonoscopy.** / Every 5 years for a flexible sigmoidoscopy or every 10 years for a colonoscopy beginning at age 50 and continuing until age 75.  Hepatitis C blood test.** / For all people born from 1945 through 1965 and any individual with known risks for hepatitis C.  Skin self-exam. / Monthly.  Influenza vaccine. / Every year.  Tetanus, diphtheria, and acellular pertussis (Tdap/Td) vaccine.** / Consult your health care provider. 1 dose of Td every 10 years.  Varicella vaccine.** / Consult your health care provider.  Zoster vaccine.** / 1 dose for adults aged 60 years or older.  Measles, mumps, rubella (MMR) vaccine.** / You need at least 1 dose of MMR if you were born in 1957 or later. You may also need a second dose.  Pneumococcal 13-valent conjugate (PCV13) vaccine.** / Consult your health care provider.  Pneumococcal polysaccharide (PPSV23) vaccine.** / 1 to 2 doses if you smoke cigarettes or if you have certain conditions.  Meningococcal vaccine.** / Consult your health care provider.  Hepatitis A vaccine.** / Consult your health care provider.  Hepatitis B vaccine.** / Consult your health care provider.  Haemophilus influenzae type b (Hib) vaccine.** / Consult your health care provider. Ages 65 and over  Blood pressure check.** / Every year.  Lipid and cholesterol check.**/ Every 5 years beginning at age 20.  Lung cancer screening. / Every year if you are aged 55-80 years and have a 30-pack-year history of smoking and currently smoke or have quit within the past 15 years. Yearly screening is stopped once you have quit smoking for at least 15 years or develop a health problem that would prevent you from having lung cancer treatment.  Fecal  occult blood test (FOBT) of stool. / Every year beginning at age 50 and continuing until age 75. You may not have to do this test if you get a colonoscopy every 10 years.  Flexible sigmoidoscopy** or colonoscopy.** / Every 5 years for a flexible sigmoidoscopy or every 10 years for a colonoscopy beginning at age 50 and continuing until age 75.  Hepatitis C blood test.** / For all people born from 1945 through 1965 and any individual with known risks for hepatitis C.  Abdominal aortic aneurysm (AAA) screening.** / A one-time screening for ages 65 to 75 years who are current or former smokers.  Skin self-exam. / Monthly.  Influenza vaccine. / Every year.  Tetanus, diphtheria, and acellular pertussis (Tdap/Td) vaccine.** / 1 dose of Td every 10 years.  Varicella vaccine.** / Consult your health care provider.  Zoster vaccine.** / 1 dose for adults aged 60 years or older.  Pneumococcal 13-valent conjugate (PCV13) vaccine.** / 1 dose for all adults aged 65 years and older.  Pneumococcal polysaccharide (PPSV23) vaccine.** / 1 dose for all adults aged 65 years and older.  Meningococcal vaccine.** / Consult your health care provider.  Hepatitis A vaccine.** / Consult your health care provider.  Hepatitis B vaccine.** / Consult your health care provider.  Haemophilus influenzae type b (Hib) vaccine.** / Consult your health care provider. **Family history and personal history of risk and conditions may change your health care provider's recommendations.   This information is not intended to replace advice given to you by your health care provider. Make sure you discuss any questions you have with your health care provider.   Document Released: 05/02/2001 Document Revised: 03/27/2014 Document Reviewed: 08/01/2010 Elsevier Interactive Patient Education 2016   Patient Education 2016 [employer].

## 2018-11-01 ENCOUNTER — Telehealth: Payer: Self-pay

## 2018-11-01 NOTE — Telephone Encounter (Signed)
Called patient to follow up after appointment with [facility] and to see if patient has seen urology. As recommended by their office.  Patient states that he is doing well and at this time does not feel that he needs to see urology.  Please review and advise if anything further is needed for this patient at this time. MPulliam, CMA/RT(R)

## 2018-11-02 NOTE — Telephone Encounter (Signed)
I do not see any c/s notes from [facility] at this time - they are not scanned into pt's chart and CMA notes do not reference anything specifically.  I cannot make recs until I see [facility] notes.    Also, if they thought he needs Urology, I am sure they explained why to him.   If pt doesn't want to go/ declines at this time, we can not make him go there

## 2018-12-14 IMAGING — CT CT RENAL STONE PROTOCOL
2 of 3 series · 16 of 46 positions shown, 18 images · non-contrast
Comparison: None.

CLINICAL DATA: Left flank pain radiating to the left lower quadrant
x1 day with decreased urinary output. Discolored urine.

EXAM:
CT ABDOMEN AND PELVIS WITHOUT CONTRAST
TECHNIQUE: Multidetector CT imaging of the abdomen and pelvis was performed
following the standard protocol without IV contrast.

[Series 2: axial st · axial · 0.60mm/px · z∈[+937,+1332]mm · 13 of 91 slices shown, 15 images]
[im 6/91  soft-tissue]
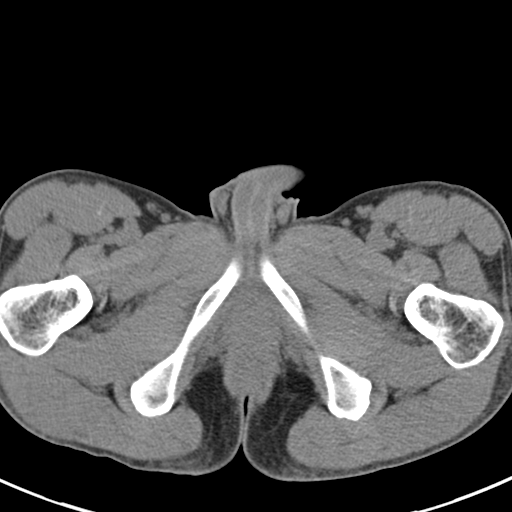
[im 6/91  bone]
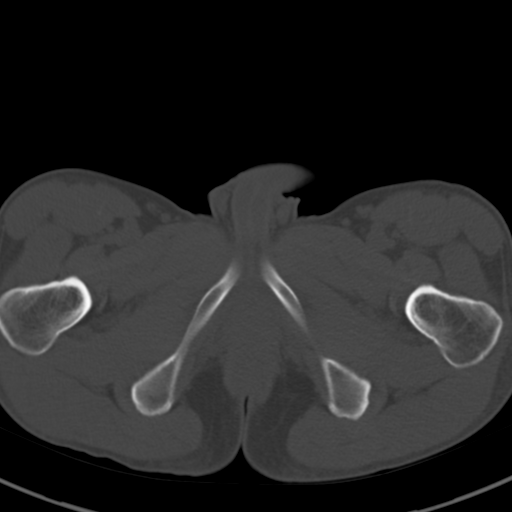
[im 12/91  soft-tissue]
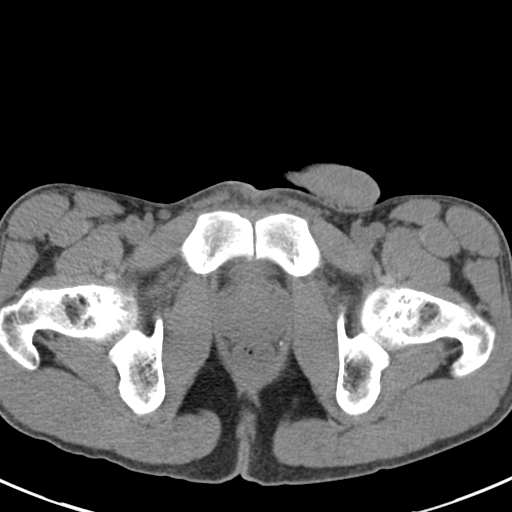
[im 18/91  soft-tissue]
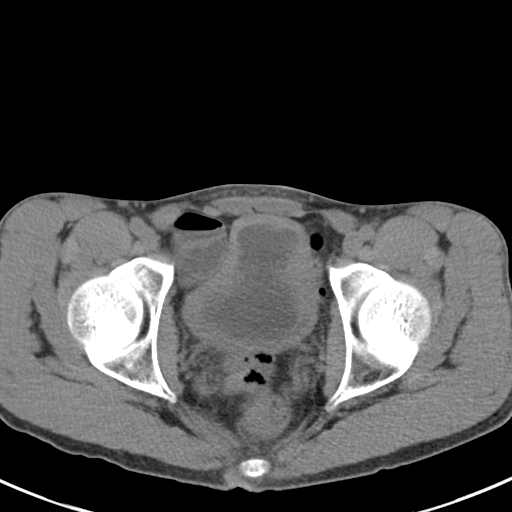
[im 27/91  soft-tissue]
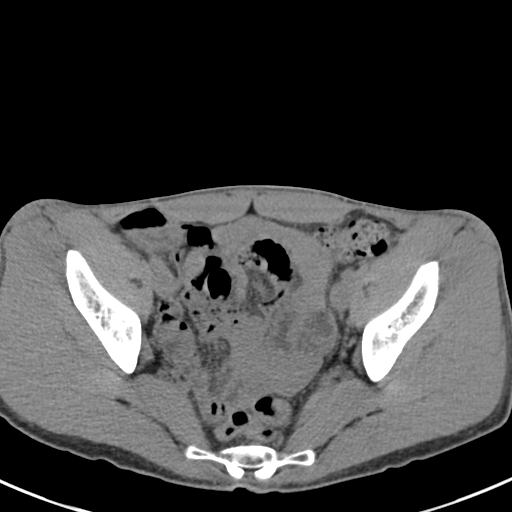
[im 32/91  soft-tissue]
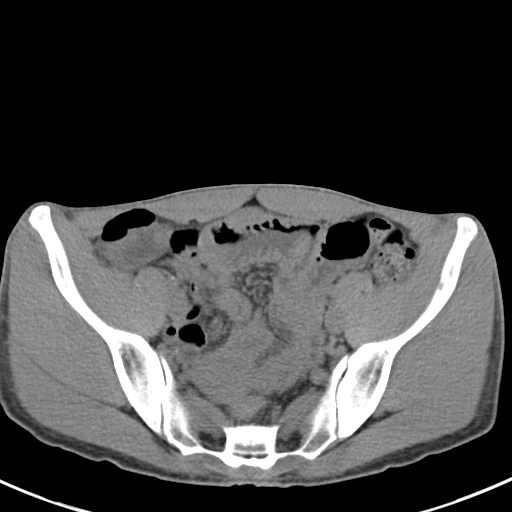
[im 38/91  soft-tissue]
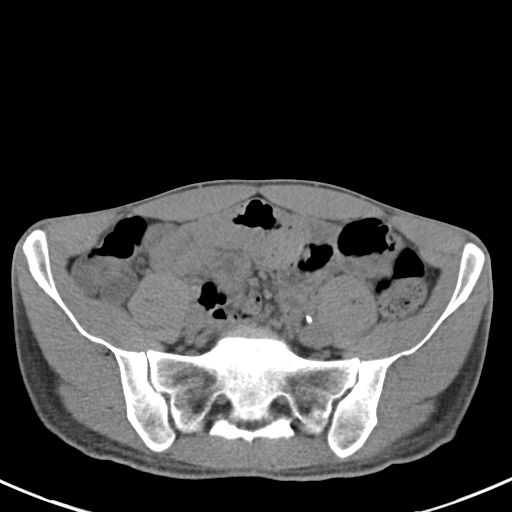
[im 47/91  soft-tissue]
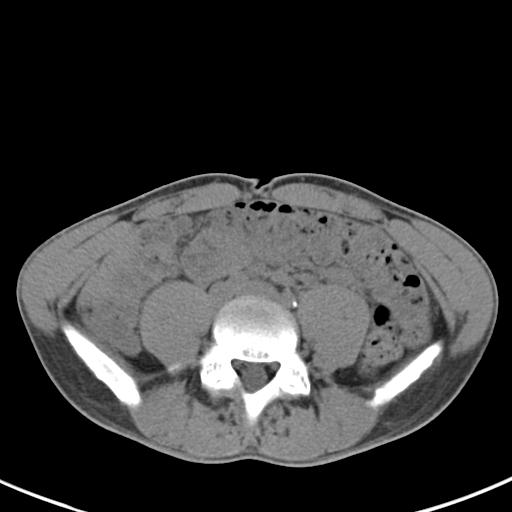
[im 53/91  soft-tissue]
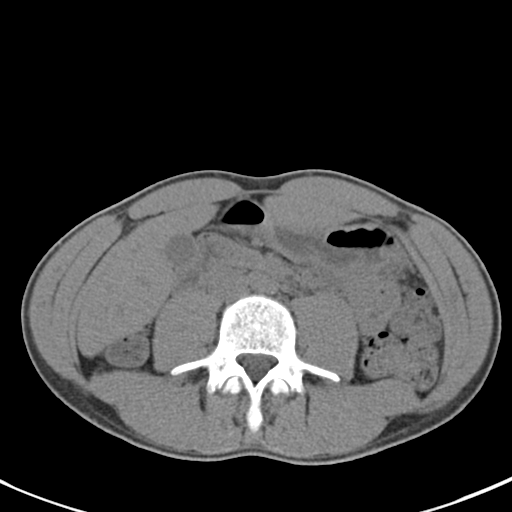
[im 59/91  soft-tissue]
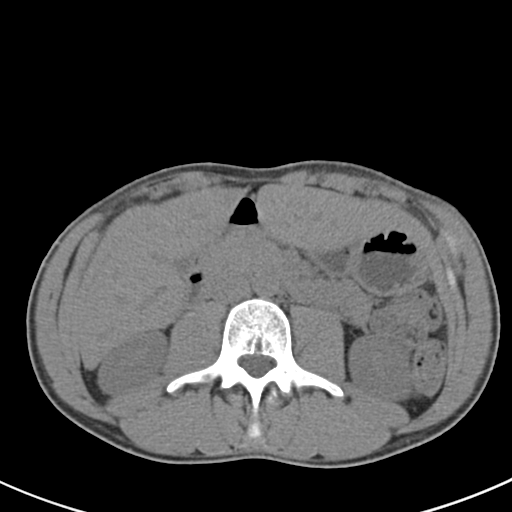
[im 59/91  bone]
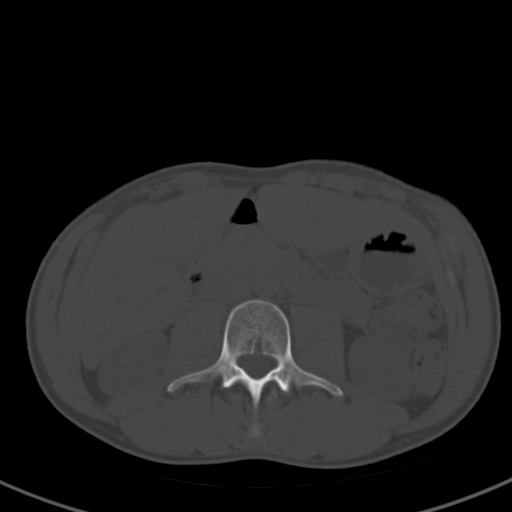
[im 64/91  soft-tissue]
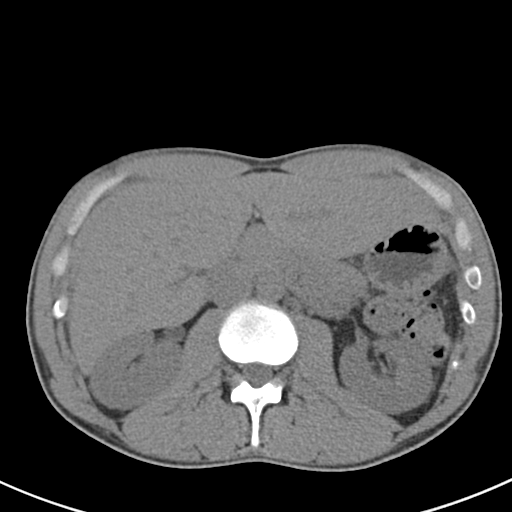
[im 73/91  soft-tissue]
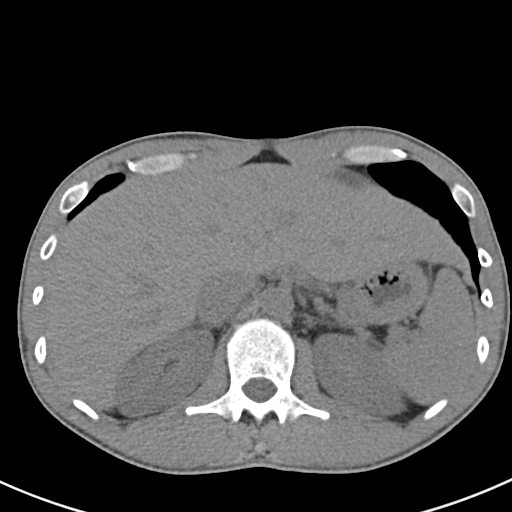
[im 79/91  soft-tissue]
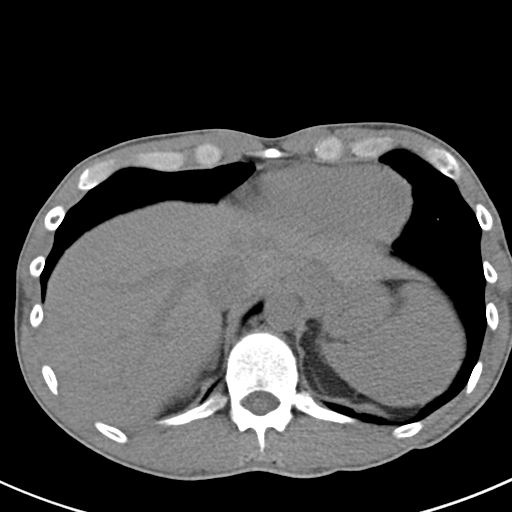
[im 85/91  soft-tissue]
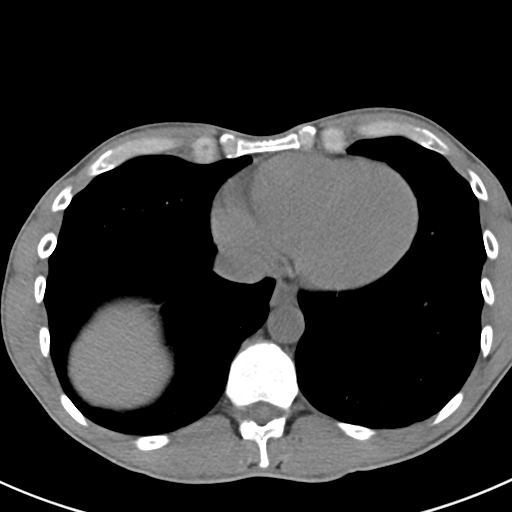

[Series 4: coronal · coronal · 0.64mm/px · 3 of 108 slices shown]
[im 36/108  soft-tissue]
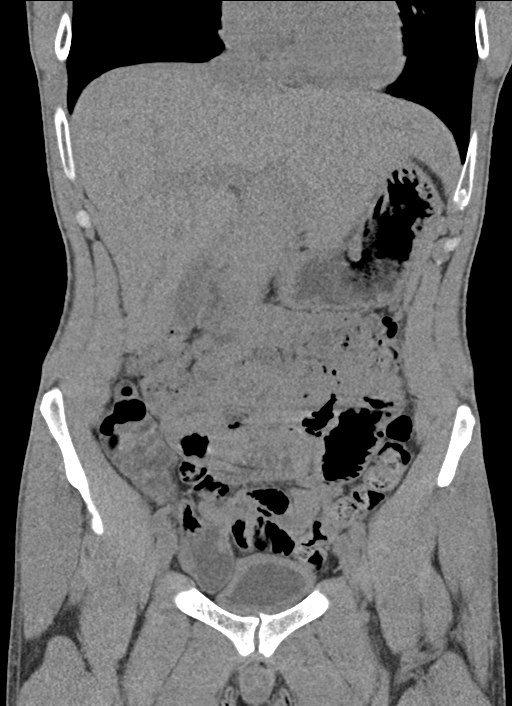
[im 48/108  soft-tissue]
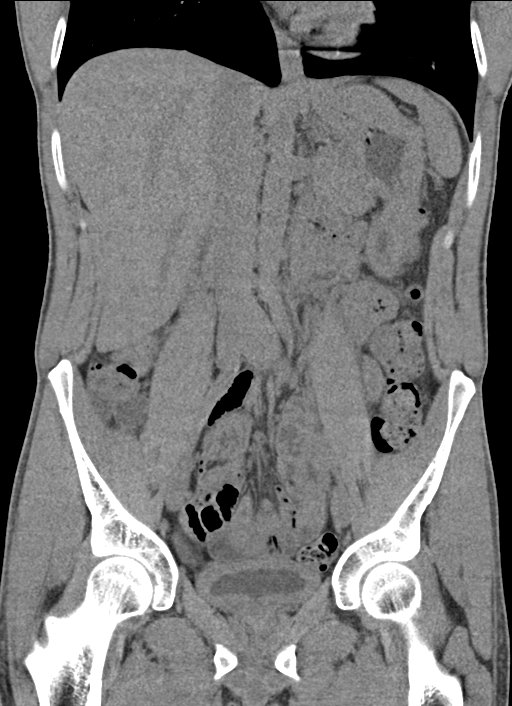
[im 60/108  soft-tissue]
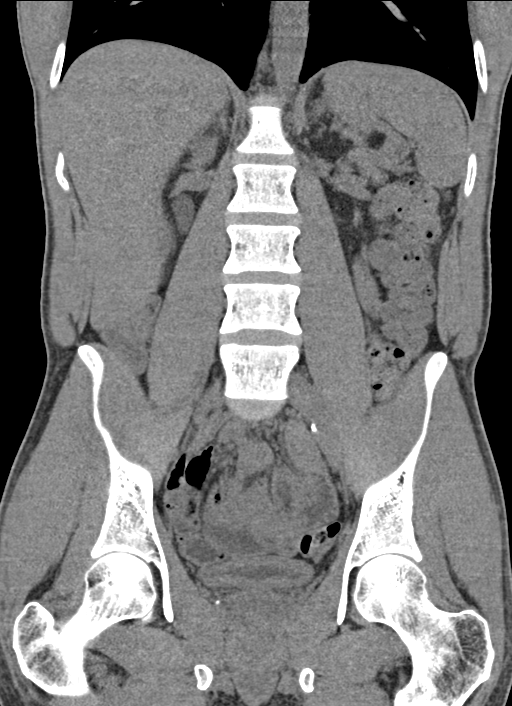

[16 of 46 positions shown; findings below may reference images not displayed]

FINDINGS: Lower chest: No acute abnormality. Dependent atelectasis. Normal
size heart.

Hepatobiliary: No focal liver abnormality is seen. No gallstones,
gallbladder wall thickening, or biliary dilatation.

Pancreas: Unremarkable. No pancreatic ductal dilatation or
surrounding inflammatory changes.

Spleen: Normal in size without focal abnormality.

Adrenals/Urinary Tract: Normal bilateral adrenal glands. No
hydroureteronephrosis nor renal calculi. Circumferential thickening
of the bladder despite underdistention. Findings are concerning for
changes of a cystitis. No intraluminal mass or calculus. A punctate
calcification is seen the left hemipelvis on series 2, image 72 but
given lack of hydroureteronephrosis, a ureteral stone is believed
less likely. There is more likely to represent a small phlebolith.

Stomach/Bowel: Stomach is within normal limits. Appendix appears
normal. No evidence of bowel wall thickening, distention, or
inflammatory changes.

Vascular/Lymphatic: No significant vascular findings are present. No
enlarged abdominal or pelvic lymph nodes.

Reproductive: Prostate is unremarkable.

Other: No abdominal wall hernia or abnormality. No abdominopelvic
ascites.

Musculoskeletal: No acute or significant osseous findings.
IMPRESSION: Circumferentially thickened urinary bladder despite underdistention,
query cystitis. No obstructive uropathy or nephrolithiasis.

## 2018-12-30 ENCOUNTER — Ambulatory Visit: Payer: BLUE CROSS/BLUE SHIELD | Admitting: Family Medicine

## 2018-12-31 ENCOUNTER — Encounter: Payer: Self-pay | Admitting: Family Medicine

## 2018-12-31 ENCOUNTER — Ambulatory Visit (INDEPENDENT_AMBULATORY_CARE_PROVIDER_SITE_OTHER): Payer: BLUE CROSS/BLUE SHIELD | Admitting: Family Medicine

## 2018-12-31 ENCOUNTER — Other Ambulatory Visit: Payer: Self-pay

## 2018-12-31 VITALS — BP 121/82 | HR 69 | Temp 98.0°F | Resp 16 | Ht 73.0 in | Wt 152.1 lb

## 2018-12-31 DIAGNOSIS — Z818 Family history of other mental and behavioral disorders: Secondary | ICD-10-CM

## 2018-12-31 DIAGNOSIS — E559 Vitamin D deficiency, unspecified: Secondary | ICD-10-CM

## 2018-12-31 DIAGNOSIS — F1290 Cannabis use, unspecified, uncomplicated: Secondary | ICD-10-CM

## 2018-12-31 DIAGNOSIS — F1510 Other stimulant abuse, uncomplicated: Secondary | ICD-10-CM

## 2018-12-31 DIAGNOSIS — Z9189 Other specified personal risk factors, not elsewhere classified: Secondary | ICD-10-CM

## 2018-12-31 DIAGNOSIS — F17200 Nicotine dependence, unspecified, uncomplicated: Secondary | ICD-10-CM

## 2018-12-31 DIAGNOSIS — F4323 Adjustment disorder with mixed anxiety and depressed mood: Secondary | ICD-10-CM | POA: Diagnosis not present

## 2018-12-31 DIAGNOSIS — Z77110 Contact with and (suspected) exposure to air pollution: Secondary | ICD-10-CM

## 2018-12-31 DIAGNOSIS — R4589 Other symptoms and signs involving emotional state: Secondary | ICD-10-CM

## 2018-12-31 DIAGNOSIS — E785 Hyperlipidemia, unspecified: Secondary | ICD-10-CM | POA: Insufficient documentation

## 2018-12-31 MED ORDER — VITAMIN D (ERGOCALCIFEROL) 1.25 MG (50000 UNIT) PO CAPS: ORAL_CAPSULE | ORAL | 3 refills | Status: AC

## 2018-12-31 NOTE — Patient Instructions (Signed)
Vitamin D Deficiency Vitamin D deficiency is when your body does not have enough vitamin D. Vitamin D is important to your body for many reasons:  It helps the body absorb two important minerals-calcium and phosphorus.  It plays a role in bone health.  It may help to prevent some diseases, such as diabetes and multiple sclerosis.  It plays a role in muscle function, including heart function. If vitamin D deficiency is severe, it can cause a condition in which your bones become soft. In adults, this condition is called osteomalacia. In children, this condition is called rickets. What are the causes? This condition may be caused by:  Not eating enough foods that contain vitamin D.  Not getting enough natural sun exposure.  Having certain digestive system diseases that make it difficult for your body to absorb vitamin D. These diseases include Crohn's disease, chronic pancreatitis, and cystic fibrosis.  Having a surgery in which a part of the stomach or a part of the small intestine is removed.  Having chronic kidney disease or liver disease. What increases the risk? You are more likely to develop this condition if you:  Are older.  Do not spend much time outdoors.  Live in a long-term care facility.  Have had broken bones.  Have weak or thin bones (osteoporosis).  Have a disease or condition that changes how the body absorbs vitamin D.  Have dark skin.  Take certain medicines, such as steroid medicines or certain seizure medicines.  Are overweight or obese. What are the signs or symptoms? In mild cases of vitamin D deficiency, there may not be any symptoms. If the condition is severe, symptoms may include:  Bone pain.  Muscle pain.  Falling often.  Broken bones caused by a minor injury. How is this diagnosed? This condition may be diagnosed with blood tests. Imaging tests such as X-rays may also be done to look for changes in the bone. How is this treated?  Treatment for this condition may depend on what caused the condition. Treatment options include:  Taking vitamin D supplements. Your health care provider will suggest what dose is best for you.  Taking a calcium supplement. Your health care provider will suggest what dose is best for you. Follow these instructions at home: Eating and drinking   Eat foods that contain vitamin D. Choices include: ? Fortified dairy products, cereals, or juices. Fortified means that vitamin D has been added to the food. Check the label on the package to see if the food is fortified. ? Fatty fish, such as salmon or trout. ? Eggs. ? Oysters. ? Mushrooms. The items listed above may not be a complete list of recommended foods and beverages. Contact a dietitian for more information. General instructions  Take medicines and supplements only as told by your health care provider.  Get regular, safe exposure to natural sunlight.  Do not use a tanning bed.  Maintain a healthy weight. Lose weight if needed.  Keep all follow-up visits as told by your health care provider. This is important. How is this prevented? You can get vitamin D by:  Eating foods that naturally contain vitamin D.  Eating or drinking products that have been fortified with vitamin D, such as cereals, juices, and dairy products (including milk).  Taking a vitamin D supplement or a multivitamin supplement that contains vitamin D.  Being in the sun. Your body naturally makes vitamin D when your skin is exposed to sunlight. Your body changes the sunlight into  a form of the vitamin that it can use. Contact a health care provider if:  Your symptoms do not go away.  You feel nauseous or you vomit.  You have fewer bowel movements than usual or are constipated. Summary  Vitamin D deficiency is when your body does not have enough vitamin D.  Vitamin D is important to your body for good bone health and muscle function, and it may help  prevent some diseases.  Vitamin D deficiency is primarily treated through supplementation. Your health care provider will suggest what dose is best for you.  You can get vitamin D by eating foods that contain vitamin D, by being in the sun, and by taking a vitamin D supplement or a multivitamin supplement that contains vitamin D. This information is not intended to replace advice given to you by your health care provider. Make sure you discuss any questions you have with your health care provider. Document Released: 05/29/2011 Document Revised: 11/12/2017 Document Reviewed: 11/12/2017 Elsevier Patient Education  2020 [place] for a Low Cholesterol, Low Saturated Fat Diet   Fats - Limit total intake of fats and oils. - Avoid butter, stick margarine, shortening, lard, palm and coconut oils. - Limit mayonnaise, salad dressings, gravies and sauces, unless they are homemade with low-fat ingredients. - Limit chocolate. - Choose low-fat and nonfat products, such as low-fat mayonnaise, low-fat or non-hydrogenated peanut butter, low-fat or fat-free salad dressings and nonfat gravy. - Use vegetable oil, such as canola or olive oil. - Look for margarine that does not contain trans fatty acids. - Use nuts in moderate amounts. - Read ingredient labels carefully to determine both amount and type of fat present in foods. Limit saturated and trans fats! - Avoid high-fat processed and convenience foods.  Meats and Meat Alternatives - Choose fish, chicken, [place] and lean meats. - Use dried beans, peas, lentils and tofu. - Limit egg yolks to three to four per week. - If you eat red meat, limit to no more than three servings per week and choose loin or round cuts. - Avoid fatty meats, such as bacon, sausage, franks, luncheon meats and ribs. - Avoid all organ meats, including liver.  Dairy - Choose nonfat or low-fat milk, yogurt and cottage cheese. - Most cheeses are high in  fat. Choose cheeses made from non-fat milk, such as mozzarella and ricotta cheese. - Choose light or fat-free cream cheese and sour cream. - Avoid cream and sauces made with cream.  Fruits and Vegetables - Eat a wide variety of fruits and vegetables. - Use lemon juice, vinegar or "mist" olive oil on vegetables. - Avoid adding sauces, fat or oil to vegetables.  Breads, Cereals and Grains - Choose whole-grain breads, cereals, pastas and rice. - Avoid high-fat snack foods, such as granola, cookies, pies, pastries, doughnuts and croissants.  Cooking Tips - Avoid deep fried foods. - Trim visible fat off meats and remove skin from poultry before cooking. - Bake, broil, boil, poach or roast poultry, fish and lean meats. - Drain and discard fat that drains out of meat as you cook it. - Add little or no fat to foods. - Use vegetable oil sprays to grease pans for cooking or baking. - Steam vegetables. - Use herbs or no-oil marinades to flavor foods.

## 2018-12-31 NOTE — Progress Notes (Signed)
Impression and Recommendations:    1. Nicotine dependence, uncomplicated, unspecified nicotine product type   2. 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 Adjustment disorder with mixed anxiety and depressed mood   5. Family history of major depression   6. Has poorly balanced diet   7. Exposure to air pollution-  various work exposures   8. Non-suicidal depressed mood   9. Hyperlipidemia, unspecified hyperlipidemia type   10. Vitamin D deficiency    4 months, re-check Vitamin D and FLP  Adjustment Disorder w/ Mixed Anxiety & Depressed Mood - Advised patient to continue to seek available avenues of counseling and self-care.  - Reviewed the "spokes of the wheel" of mood and health management.  Stressed the importance of ongoing prudent habits, including regular exercise, appropriate sleep hygiene, healthful dietary habits, and prayer/meditation to relax.  - Discussed beginning mood medication in the future PRN.  Patient knows to call in and let us know if his emotional state changes or symptoms become intolerable.  - Will continue to monitor.  Exposure to [place] patient to wear protective masks at work as advised. - Extensive education provided to patient today and all questions answered. - Will continue to monitor.  Vitamin D Deficiency - 18.7 last check, seven months ago. - Prescription provided today. - Will continue to monitor and re-check as recommended.  Hyperlipidemia - Poorly Balanced Diet - Encouraged patient to continue to improve dietary habits and reduce intake of red meat.  - Prudent dietary changes such as low saturated & trans fat diets for hyperlipidemia and low carb diets discussed with patient.    - Encouraged patient to follow AHA guidelines for regular exercise.  - Educational handouts provided at patient's desire and/ or told to look online at the [employer] website for  further information.  - We will continue to monitor and re-check as recommended.  Caffeine Abuse - Encouraged patient to continue to reduce intake of caffeine. - Discussed prudent caffeine intake habits with patient today. - Education provided and all questions answered. - Will continue to monitor.  Smoking Cessation - Nicotine Dependence, Marijuana Smoker - Encouraged patient to continue with smoking cessation goals. - Extensive education provided and all questions answered.  - Told pt to think seriously about quitting completely!  Told pt it is very important for his health and well being.   - Smoking cessation instruction/ counseling given of at least 5 minutes:  counseled patient on the dangers of tobacco use and reviewed strategies to maximize success  - Discussed with patient that there are multiple treatments to aid in quitting smoking, however I explained none will work unless pt really wants to quit  - Told to call 1-800-QUIT-NOW 815-005-2430) for free smoking cessation counseling and support, or pt can go online to www.heart.org - the American Heart Association website and search "quit smoking ".   - Will continue to monitor.  Health Counseling & Preventative Health Maintenance - Advised patient to continue working toward exercising to improve overall mental, physical, and emotional health.    - Encouraged patient to engage in daily physical activity, especially a formal exercise routine.  Recommended that the patient eventually strive for at least 150 minutes of moderate cardiovascular activity per week according to guidelines established by the [facility].   - Healthy dietary habits encouraged, including low-carb, and high amounts of lean protein in diet.   -  Patient should also consume adequate amounts of water.  - Health counseling performed.  All questions answered.   Education and routine counseling performed. Handouts provided.    Recommendations - Return in 4  months, re-check Vitamin D and FLP;   CPE 63mo or so   Meds ordered this encounter  Medications  . Vitamin D, Ergocalciferol, (DRISDOL) 1.25 MG (50000 UT) CAPS capsule    Sig: Take one tablet wkly    Dispense:  12 capsule    Refill:  3    Gross side effects, risk and benefits, and alternatives of medications and treatment plan in general discussed with patient.  Patient is aware that all medications have potential side effects and we are unable to predict every side effect or drug-drug interaction that may occur.   Patient will call with any questions prior to using medication if they have concerns.  Expresses verbal understanding and consents to current therapy and treatment regimen.  No barriers to understanding were identified.  Red flag symptoms and signs discussed in detail.  Patient expressed understanding regarding what to do in case of emergency\urgent symptoms  Please see AVS handed out to patient at the end of our visit for further patient instructions/ counseling done pertaining to today's office visit.   Return for 4 months for FLP and vitamin D- lab only then 6 months CPE.     Note:  This document was prepared using Dragon voice recognition software and may include unintentional dictation errors.   This document serves as a record of services personally performed by Mellody Dance, DO. It was created on her behalf by Toni Amend, a trained medical scribe. The creation of this record is based on the scribe's personal observations and the provider's statements to them.   I have reviewed the above medical documentation for accuracy and completeness and I concur.  Mellody Dance, DO 12/31/2018 9:28 AM      --------------------------------------------------------------------------------------------------------------------------------------------------------------------------------------------------------------------------------------------    Subjective:    CC:   Chief Complaint  Patient presents with  . Follow-up    stable    HPI: Julian Clark is a 38 y.o. male who presents to [place] at [facility] today for follow-up of mood.   Notes "I've taken your advice on quite a bit of things."  He has not been taking Vitamin D.  - Smoking He has cut down on his smoking.    Says he does cheat around certain friends, but doesn't smoke every day.  He has cut his use of marijuana in half "involuntarily."  Notes "my betrothed is having physical issues" and he gave half of his weed money to her for her needs.  - Eating Habits Has been eating a lot less red meat and "a lot more fish."  Says he was given a lot of frozen fish by a friend who recently became vegan.  - Caffeine Intake States "I'm still a coffee drinker."  Notes he drinks a lot of coffee at work, but "I do drink water all day."  Notes sugar is making his "stomach feel like it's been kicked by a horse" lately.  Says he had an oatmeal cookie recently and had to "curl up into a ball" because his stomach hurt so bad.  Says "I really feel like it's the sugar (causing the stomach pain), because it happened with an oatmeal cookie and a mountain dew."  - Lifestyle & Mood Says he's been being more "conscious and preemptive about church."  Says "still  has a lot of issues but it's stuff we'll always be dealing with."  Notes "we're in a better place now."  Is not currently obtaining counseling through his pastor.  Notes "not really doing a whole lot for himself," because "I'm more of a giver, and people who are givers tend to find takers."  A friend gave him a double wide trailer and remarks "I am remodeling my home right now" and "the past couple of months have been pretty busy."  Denies concerns emotionally.  Says "there's always relational issues, there's always something, it's all in learning how to deal with it."  - Protective Equipment at Work Says work is "about the same;  'it's work.'"  He has not been wearing too much more protective equipment at work.  He is exposed to a lot of dust; states "it's a dirty environment."  Says he's been trying to do better about wearing the under-the-helmet mask; "it's just not something you think about all the time."    Depression screen [facility] 2/9 12/31/2018 08/29/2018 04/16/2018  Decreased Interest 1 0 3  Down, Depressed, Hopeless 1 0 3  PHQ - 2 Score 2 0 6  Altered sleeping - 0 1  Tired, decreased energy 1 3 3   Change in appetite 0 1 3  Feeling bad or failure about yourself  1 0 1  Trouble concentrating 0 0 1  Moving slowly or fidgety/restless 0 3 3  Suicidal thoughts 1 0 1  PHQ-9 Score - 7 19  Difficult doing work/chores Somewhat difficult Somewhat difficult -     GAD 7 : Generalized Anxiety Score 08/29/2018 04/16/2018  Nervous, Anxious, on Edge 0 2  Control/stop worrying 0 2  Worry too much - different things 0 2  Trouble relaxing 0 3  Restless 0 2  Easily annoyed or irritable 0 3  Afraid - awful might happen 0 2  Total GAD 7 Score 0 16  Anxiety Difficulty Not difficult at all Somewhat difficult     Wt Readings from Last 3 Encounters:  12/31/18 152 lb 1.6 oz (69 kg)  08/29/18 151 lb (68.5 kg)  04/16/18 151 lb 8 oz (68.7 kg)   BP Readings from Last 3 Encounters:  12/31/18 121/82  08/29/18 111/69  04/16/18 115/79   Pulse Readings from Last 3 Encounters:  12/31/18 69  08/29/18 (!) 50  04/16/18 (!) 59   BMI Readings from Last 3 Encounters:  12/31/18 20.07 kg/m  08/29/18 21.06 kg/m  04/16/18 19.99 kg/m         Patient Care Team    Relationship Specialty Notifications Start End  Mellody Dance, DO PCP - General Family Medicine  04/16/18      Patient Active Problem List   Diagnosis Date Noted  . Hyperlipidemia 12/31/2018  . 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 Groin pain, right 08/29/2018  . Non-suicidal depressed mood 04/16/2018   . Adjustment disorder with mixed anxiety and depressed mood 04/16/2018  . Family history of major depression 04/16/2018  . 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 Nicotine dependence, uncomplicated [id number]  . Marijuana smoker, continuous 04/16/2018  . Has poorly balanced diet 04/16/2018  . Exposure to air pollution-  various work exposures 04/16/2018    Past Medical history, Surgical history, Family history, Social history, Allergies and Medications have been entered into the medical record, reviewed and changed as needed.    No outpatient medications have been marked as taking for the  12/31/18 encounter (Office Visit) with Mellody Dance, DO.    Allergies:  Allergies  Allergen Reactions  . Penicillins Hives and Rash    Has patient had a PCN reaction causing immediate rash, facial/tongue/throat swelling, SOB or lightheadedness with hypotension: yes Has patient had a PCN reaction causing severe rash involving mucus membranes or skin necrosis:  no Has patient had a PCN reaction that required hospitalization: no Has patient had a PCN reaction occurring within the last 10 years: no If all of the above answers are "NO", then may proceed with Cephalosporin use.      Review of Systems: Review of Systems: General:   No F/C, wt loss Pulm:   No DIB, SOB, pleuritic chest pain Card:  No CP, palpitations Abd:  No n/v/d or pain Ext:  No inc edema from baseline Psych: no SI/ HI    Objective:   Blood pressure 121/82, pulse 69, temperature 98 F (36.7 C), resp. rate 16, height 6\' 1"  (1.854 m), weight 152 lb 1.6 oz (69 kg), SpO2 98 %. Body mass index is 20.07 kg/m. General:  Well Developed, well nourished, appropriate for stated age.  Neuro:  Alert and oriented,  extra-ocular muscles intact  HEENT:  Normocephalic, atraumatic, neck supple, no carotid bruits appreciated  Skin:  no gross rash, warm, pink. Cardiac:  RRR, S1 S2 Respiratory:  ECTA B/L and A/P, Not using accessory muscles,  speaking in full sentences- unlabored. Vascular:  Ext warm, no cyanosis apprec.; cap RF less 2 sec. Psych:  No HI/SI, judgement and insight good, Euthymic mood. Full Affect.
